# Patient Record
Sex: Male | Born: 1940 | Race: White | Hispanic: No | Marital: Married | Smoking: Former smoker
Health system: Southern US, Community
[De-identification: ages and names within clinical notes are randomized; demographics above are authoritative.]

## PROBLEM LIST (undated history)

## (undated) DIAGNOSIS — E039 Hypothyroidism, unspecified: Secondary | ICD-10-CM

## (undated) DIAGNOSIS — K219 Gastro-esophageal reflux disease without esophagitis: Secondary | ICD-10-CM

## (undated) HISTORY — PX: COLONOSCOPY: SHX174

## (undated) HISTORY — PX: ESOPHAGOGASTRODUODENOSCOPY ENDOSCOPY: SHX5814

---

## 2009-05-17 ENCOUNTER — Encounter: Admission: RE | Admit: 2009-05-17 | Discharge: 2009-05-17 | Payer: Self-pay | Admitting: Gastroenterology

## 2012-11-23 ENCOUNTER — Ambulatory Visit
Admission: RE | Admit: 2012-11-23 | Discharge: 2012-11-23 | Disposition: A | Discharge status: Home / Self Care | Payer: Medicare Other | Source: Ambulatory Visit | Attending: Family Medicine | Admitting: Family Medicine

## 2012-11-23 ENCOUNTER — Inpatient Hospital Stay (HOSPITAL_COMMUNITY)
Admission: EM | Admit: 2012-11-23 | Discharge: 2012-12-05 | DRG: 339 | Disposition: A | Discharge status: Home Health | Payer: Medicare Other | Attending: General Surgery | Admitting: General Surgery

## 2012-11-23 ENCOUNTER — Other Ambulatory Visit: Payer: Self-pay | Admitting: Family Medicine

## 2012-11-23 ENCOUNTER — Encounter (HOSPITAL_COMMUNITY): Payer: Self-pay | Admitting: *Deleted

## 2012-11-23 ENCOUNTER — Emergency Department (HOSPITAL_COMMUNITY): Payer: Medicare Other | Admitting: Anesthesiology

## 2012-11-23 ENCOUNTER — Encounter (HOSPITAL_COMMUNITY): Payer: Self-pay | Admitting: Anesthesiology

## 2012-11-23 ENCOUNTER — Emergency Department (HOSPITAL_COMMUNITY): Payer: Medicare Other

## 2012-11-23 ENCOUNTER — Encounter (HOSPITAL_COMMUNITY): Admission: EM | Disposition: A | Discharge status: Home Health | Payer: Self-pay | Source: Home / Self Care

## 2012-11-23 DIAGNOSIS — K352 Acute appendicitis with generalized peritonitis, without abscess: Secondary | ICD-10-CM

## 2012-11-23 DIAGNOSIS — R1031 Right lower quadrant pain: Secondary | ICD-10-CM

## 2012-11-23 DIAGNOSIS — K219 Gastro-esophageal reflux disease without esophagitis: Secondary | ICD-10-CM | POA: Diagnosis present

## 2012-11-23 DIAGNOSIS — I10 Essential (primary) hypertension: Secondary | ICD-10-CM | POA: Diagnosis present

## 2012-11-23 DIAGNOSIS — K3532 Acute appendicitis with perforation and localized peritonitis, without abscess: Secondary | ICD-10-CM | POA: Diagnosis present

## 2012-11-23 DIAGNOSIS — Z5331 Laparoscopic surgical procedure converted to open procedure: Secondary | ICD-10-CM

## 2012-11-23 DIAGNOSIS — K37 Unspecified appendicitis: Secondary | ICD-10-CM

## 2012-11-23 DIAGNOSIS — Z87891 Personal history of nicotine dependence: Secondary | ICD-10-CM

## 2012-11-23 DIAGNOSIS — K3533 Acute appendicitis with perforation and localized peritonitis, with abscess: Principal | ICD-10-CM | POA: Diagnosis present

## 2012-11-23 DIAGNOSIS — K560 Paralytic ileus: Secondary | ICD-10-CM | POA: Diagnosis not present

## 2012-11-23 DIAGNOSIS — E871 Hypo-osmolality and hyponatremia: Secondary | ICD-10-CM | POA: Diagnosis present

## 2012-11-23 DIAGNOSIS — E039 Hypothyroidism, unspecified: Secondary | ICD-10-CM | POA: Diagnosis present

## 2012-11-23 HISTORY — DX: Hypothyroidism, unspecified: E03.9

## 2012-11-23 HISTORY — PX: APPENDECTOMY: SHX54

## 2012-11-23 HISTORY — DX: Gastro-esophageal reflux disease without esophagitis: K21.9

## 2012-11-23 SURGERY — APPENDECTOMY
Anesthesia: General | Site: Abdomen | Laterality: Right | Wound class: Dirty or Infected

## 2012-11-23 MED ORDER — HYDROMORPHONE HCL PF 1 MG/ML IJ SOLN
INTRAMUSCULAR | Status: AC
  Filled 2012-11-23: qty 1

## 2012-11-23 MED ORDER — PROMETHAZINE HCL 25 MG/ML IJ SOLN: 6.2500 mg | INTRAMUSCULAR | Status: DC | PRN

## 2012-11-23 MED ORDER — BUPIVACAINE-EPINEPHRINE 0.25% -1:200000 IJ SOLN
INTRAMUSCULAR | Status: AC
  Filled 2012-11-23: qty 1

## 2012-11-23 MED ORDER — DEXAMETHASONE SODIUM PHOSPHATE 10 MG/ML IJ SOLN
INTRAMUSCULAR | Status: DC | PRN
  Administered 2012-11-23: 10 mg via INTRAVENOUS

## 2012-11-23 MED ORDER — SODIUM CHLORIDE 0.9 % IV SOLN
1000.0000 mL | Freq: Once | INTRAVENOUS | Status: AC
  Administered 2012-11-23: 1000 mL via INTRAVENOUS

## 2012-11-23 MED ORDER — SUCCINYLCHOLINE CHLORIDE 20 MG/ML IJ SOLN
INTRAMUSCULAR | Status: DC | PRN
  Administered 2012-11-23: 100 mg via INTRAVENOUS

## 2012-11-23 MED ORDER — LACTATED RINGERS IV SOLN
INTRAVENOUS | Status: DC | PRN
  Administered 2012-11-23: 21:00:00 via INTRAVENOUS

## 2012-11-23 MED ORDER — HYDROMORPHONE HCL PF 1 MG/ML IJ SOLN
0.2500 mg | INTRAMUSCULAR | Status: DC | PRN
  Administered 2012-11-23 (×3): 0.5 mg via INTRAVENOUS

## 2012-11-23 MED ORDER — ONDANSETRON HCL 4 MG/2ML IJ SOLN
INTRAMUSCULAR | Status: DC | PRN
  Administered 2012-11-23: 4 mg via INTRAVENOUS

## 2012-11-23 MED ORDER — 0.9 % SODIUM CHLORIDE (POUR BTL) OPTIME
TOPICAL | Status: DC | PRN
  Administered 2012-11-23: 1000 mL

## 2012-11-23 MED ORDER — SODIUM CHLORIDE 0.9 % IV SOLN
1000.0000 mL | INTRAVENOUS | Status: DC
  Administered 2012-11-23: 1000 mL via INTRAVENOUS

## 2012-11-23 MED ORDER — FENTANYL CITRATE 0.05 MG/ML IJ SOLN
INTRAMUSCULAR | Status: DC | PRN
  Administered 2012-11-23: 50 ug via INTRAVENOUS
  Administered 2012-11-23: 100 ug via INTRAVENOUS
  Administered 2012-11-23 (×2): 50 ug via INTRAVENOUS

## 2012-11-23 MED ORDER — BUPIVACAINE-EPINEPHRINE 0.25% -1:200000 IJ SOLN
INTRAMUSCULAR | Status: DC | PRN
  Administered 2012-11-23: 35 mL

## 2012-11-23 MED ORDER — PIPERACILLIN-TAZOBACTAM 3.375 G IVPB
3.3750 g | Freq: Once | INTRAVENOUS | Status: AC
  Administered 2012-11-23: 3.375 g via INTRAVENOUS
  Filled 2012-11-23: qty 50

## 2012-11-23 MED ORDER — NEOSTIGMINE METHYLSULFATE 1 MG/ML IJ SOLN
INTRAMUSCULAR | Status: DC | PRN
  Administered 2012-11-23: 4 mg via INTRAVENOUS

## 2012-11-23 MED ORDER — ROCURONIUM BROMIDE 100 MG/10ML IV SOLN
INTRAVENOUS | Status: DC | PRN
  Administered 2012-11-23: 5 mg via INTRAVENOUS
  Administered 2012-11-23: 25 mg via INTRAVENOUS

## 2012-11-23 MED ORDER — MORPHINE SULFATE 4 MG/ML IJ SOLN
6.0000 mg | Freq: Once | INTRAMUSCULAR | Status: AC
  Administered 2012-11-23: 4 mg via INTRAVENOUS
  Filled 2012-11-23: qty 1

## 2012-11-23 MED ORDER — IOHEXOL 300 MG/ML  SOLN
100.0000 mL | Freq: Once | INTRAMUSCULAR | Status: AC | PRN
  Administered 2012-11-23: 100 mL via INTRAVENOUS

## 2012-11-23 MED ORDER — GLYCOPYRROLATE 0.2 MG/ML IJ SOLN
INTRAMUSCULAR | Status: DC | PRN
  Administered 2012-11-23: .6 mg via INTRAVENOUS

## 2012-11-23 MED ORDER — PROPOFOL 10 MG/ML IV BOLUS
INTRAVENOUS | Status: DC | PRN
  Administered 2012-11-23: 150 mg via INTRAVENOUS

## 2012-11-23 MED ORDER — LACTATED RINGERS IV SOLN: INTRAVENOUS | Status: DC

## 2012-11-23 MED ORDER — LACTATED RINGERS IR SOLN
Status: DC | PRN
  Administered 2012-11-23: 1000 mL

## 2012-11-23 SURGICAL SUPPLY — 57 items
APPLIER CLIP ROT 10 11.4 M/L (STAPLE)
BENZOIN TINCTURE PRP APPL 2/3 (GAUZE/BANDAGES/DRESSINGS) IMPLANT
BLADE SURG SZ10 CARB STEEL (BLADE) ×3 IMPLANT
CANISTER SUCTION 2500CC (MISCELLANEOUS) ×3 IMPLANT
CLIP APPLIE ROT 10 11.4 M/L (STAPLE) IMPLANT
CLOTH BEACON ORANGE TIMEOUT ST (SAFETY) ×3 IMPLANT
CUTTER FLEX LINEAR 45M (STAPLE) IMPLANT
DECANTER SPIKE VIAL GLASS SM (MISCELLANEOUS) ×3 IMPLANT
DRAIN CHANNEL 19F RND (DRAIN) ×3 IMPLANT
DRAPE LAPAROSCOPIC ABDOMINAL (DRAPES) ×3 IMPLANT
ELECT REM PT RETURN 9FT ADLT (ELECTROSURGICAL) ×3
ELECTRODE REM PT RTRN 9FT ADLT (ELECTROSURGICAL) ×2 IMPLANT
ENDOLOOP SUT PDS II  0 18 (SUTURE)
ENDOLOOP SUT PDS II 0 18 (SUTURE) IMPLANT
EVACUATOR DRAINAGE 10X20 100CC (DRAIN) ×2 IMPLANT
EVACUATOR SILICONE 100CC (DRAIN) ×1
GLOVE BIOGEL PI IND STRL 7.0 (GLOVE) IMPLANT
GLOVE BIOGEL PI INDICATOR 7.0 (GLOVE)
GLOVE SURG ORTHO 8.0 STRL STRW (GLOVE) ×3 IMPLANT
GLOVE SURG SS PI 8.5 STRL IVOR (GLOVE) ×1
GLOVE SURG SS PI 8.5 STRL STRW (GLOVE) ×2 IMPLANT
GOWN STRL NON-REIN LRG LVL3 (GOWN DISPOSABLE) ×3 IMPLANT
GOWN STRL REIN XL XLG (GOWN DISPOSABLE) ×6 IMPLANT
HAND ACTIVATED (MISCELLANEOUS) ×3 IMPLANT
IV LACTATED RINGERS 1000ML (IV SOLUTION) ×3 IMPLANT
KIT BASIN OR (CUSTOM PROCEDURE TRAY) ×3 IMPLANT
NS IRRIG 1000ML POUR BTL (IV SOLUTION) ×3 IMPLANT
PENCIL BUTTON HOLSTER BLD 10FT (ELECTRODE) ×3 IMPLANT
POUCH SPECIMEN RETRIEVAL 10MM (ENDOMECHANICALS) IMPLANT
RELOAD 45 VASCULAR/THIN (ENDOMECHANICALS) IMPLANT
RELOAD STAPLE TA45 3.5 REG BLU (ENDOMECHANICALS) IMPLANT
RELOAD STAPLER LINE PROX 30 GR (STAPLE) ×2 IMPLANT
SET IRRIG TUBING LAPAROSCOPIC (IRRIGATION / IRRIGATOR) ×3 IMPLANT
SOLUTION ANTI FOG 6CC (MISCELLANEOUS) ×3 IMPLANT
SPONGE GAUZE 4X4 12PLY (GAUZE/BANDAGES/DRESSINGS) ×6 IMPLANT
SPONGE LAP 4X18 X RAY DECT (DISPOSABLE) ×3 IMPLANT
STAPLER RELOAD LINE PROX 30 GR (STAPLE) ×3
STAPLER VISISTAT 35W (STAPLE) ×3 IMPLANT
STRIP CLOSURE SKIN 1/2X4 (GAUZE/BANDAGES/DRESSINGS) IMPLANT
SUCTION POOLE TIP (SUCTIONS) ×3 IMPLANT
SUT ETHILON 2 0 PS N (SUTURE) ×3 IMPLANT
SUT MNCRL AB 4-0 PS2 18 (SUTURE) ×3 IMPLANT
SUT VIC AB 0 CT1 27 (SUTURE) ×1
SUT VIC AB 0 CT1 27XBRD ANTBC (SUTURE) ×2 IMPLANT
SUT VIC AB 1 CT1 27 (SUTURE) ×1
SUT VIC AB 1 CT1 27XBRD ANTBC (SUTURE) ×2 IMPLANT
SYR BULB IRRIGATION 50ML (SYRINGE) ×3 IMPLANT
TAPE CLOTH SURG 4X10 WHT LF (GAUZE/BANDAGES/DRESSINGS) ×3 IMPLANT
TOWEL OR 17X26 10 PK STRL BLUE (TOWEL DISPOSABLE) ×3 IMPLANT
TRAY FOLEY CATH 14FRSI W/METER (CATHETERS) ×3 IMPLANT
TRAY LAP CHOLE (CUSTOM PROCEDURE TRAY) ×3 IMPLANT
TROCAR BLADELESS OPT 5 75 (ENDOMECHANICALS) ×3 IMPLANT
TROCAR XCEL BLUNT TIP 100MML (ENDOMECHANICALS) ×3 IMPLANT
TROCAR XCEL NON-BLD 11X100MML (ENDOMECHANICALS) IMPLANT
TUBING INSUFFLATION 10FT LAP (TUBING) ×3 IMPLANT
WATER STERILE IRR 1500ML POUR (IV SOLUTION) IMPLANT
YANKAUER SUCT BULB TIP 10FT TU (MISCELLANEOUS) ×3 IMPLANT

## 2012-11-23 NOTE — H&P (Signed)
Jack Stephens is an 72 y.o. male.    General Surgery [facility]  Chief Complaint: abdominal pain, perforated acute appendicitis  HPI: patient is a pleasant 72 year old white male referred by his primary care physician for acute appendicitis with probable perforation. Patient has a 4 day history of generalized abdominal pain localizing to the right lower quadrant. This has been associated with low-grade fever, sweats, and chills. Patient presented to his primary physician. Laboratory studies showed an elevated white blood cell count of 22.3. CT scan of the abdomen and pelvis was performed and showed findings consistent with acute appendicitis with probable perforation. Patient was sent to the emergency department for surgical evaluation and management.  Patient has no prior history of abdominal surgery.  Past Medical History  Diagnosis Date  . Hypothyroidism   . GERD (gastroesophageal reflux disease)     Past Surgical History  Procedure Laterality Date  . Esophagogastroduodenoscopy endoscopy    . Colonoscopy      x 2    History reviewed. No pertinent family history. Social History:  reports that he quit smoking about 9 years ago. His smoking use included Pipe. He has never used smokeless tobacco. He reports that  drinks alcohol. He reports that he does not use illicit drugs.  Allergies: No Known Allergies   (Not in a hospital admission)  No results found for this or any previous visit (from the past 48 hour(s)). Dg Chest 1 View  11/23/2012  *RADIOLOGY REPORT*  Clinical Data: Preoperative assessment for appendectomy, former smoker  CHEST - 1 VIEW  Comparison: None  Findings: Upper normal heart size. Tortuous aorta. Pulmonary vascularity normal for technique and lordotic positioning. Lungs grossly clear. No pleural effusion or pneumothorax.  IMPRESSION: No definite acute abnormalities.   Original Report Authenticated By: Ulyses Southward, M.D.    Ct Abdomen Pelvis W  Contrast  11/23/2012  *RADIOLOGY REPORT*  Clinical Data: Right lower quadrant pain.  Evaluate for appendicitis.  Nausea.  CT ABDOMEN AND PELVIS WITH CONTRAST  Technique:  Multidetector CT imaging of the abdomen and pelvis was performed following the standard protocol during bolus administration of intravenous contrast.  Contrast: OMNIPAQUE IOHEXOL 300 MG/ML  SOLN  Comparison: None.  Findings: Lung bases show atelectasis or scarring in the right lower lobe.  No pleural fluid.  Coronary artery calcification. Heart size normal.  No pericardial effusion.  A focal area of low attenuation in the left hepatic lobe (image 18) is felt to represent volume averaging with adjacent fat, rather than a true lesion.  Liver, gallbladder, adrenal glands and right kidney are unremarkable.  A 4.4 x 8.4 cm low attenuation mass in the left kidney is likely a cyst.  Spleen, pancreas, stomach and majority of small bowel are unremarkable.  There is wall thickening involving the distal/terminal ileum and cecum.  Extensive inflammatory change and fluid are seen in the adjacent fat with a single locule of air (image 56).  The appendix is dilated, measuring 12 mm (image 59).  Remainder of the colon is unremarkable.  Ileocolic mesenteric lymph nodes are not enlarged by CT size criteria.  No pathologically enlarged lymph nodes.  Atherosclerotic calcification of the arterial vasculature without abdominal aortic aneurysm.  Small bilateral inguinal hernias contain fat.  No worrisome lytic or sclerotic lesions.  IMPRESSION:  Constellation of inflammatory findings in the right lower quadrant are most indicative of acute appendicitis with localized rupture. Report will be called to the referring physician's office by [facility]  CT technologist, Fulton Mole, at the time of dictation.   Original Report Authenticated By: Leanna Battles, M.D.     Review of Systems  Constitutional: Positive for fever and chills. Negative for weight loss,  malaise/fatigue and diaphoresis.  HENT: Negative.   Eyes: Negative.   Respiratory: Negative.   Cardiovascular: Negative.   Gastrointestinal: Positive for nausea and abdominal pain (localizing to RLQ). Negative for heartburn, vomiting, diarrhea, constipation, blood in stool and melena.  Genitourinary: Negative.   Musculoskeletal: Negative.   Skin: Negative.   Neurological: Negative.  Negative for weakness.  Endo/Heme/Allergies: Negative.   Psychiatric/Behavioral: Negative.     Blood pressure 145/80, pulse 91, temperature 100 F (37.8 C), temperature source Oral, resp. rate 20, SpO2 97.00%. Physical Exam  Constitutional: He is oriented to person, place, and time. He appears well-developed and well-nourished. No distress.  HENT:  Head: Normocephalic and atraumatic.  Right Ear: External ear normal.  Left Ear: External ear normal.  Mouth/Throat: Oropharynx is clear and moist.  Eyes: Conjunctivae are normal. Pupils are equal, round, and reactive to light. No scleral icterus.  Neck: Normal range of motion. Neck supple. No tracheal deviation present. No thyromegaly present.  Cardiovascular: Normal rate, regular rhythm and normal heart sounds.   No murmur heard. Respiratory: Effort normal and breath sounds normal. No respiratory distress.  GI: Soft. Bowel sounds are normal. He exhibits no distension and no mass. There is tenderness (localized to RLQ with guarding). There is guarding. There is no rebound.  Musculoskeletal: Normal range of motion. He exhibits no edema.  Lymphadenopathy:    He has no cervical adenopathy.  Neurological: He is alert and oriented to person, place, and time.  Skin: Skin is warm and dry. He is not diaphoretic.  Psychiatric: He has a normal mood and affect. His behavior is normal.     Assessment/Plan Acute appendicitis with probable perforation  I discussed the findings noted above with the patient and his family. We discussed laparoscopic appendectomy and the  possible need for conversion to open surgery. We discussed the hospital course to be anticipated and the need for postoperative antibiotic therapy. We discussed the possibility of an open abdominal wound. They understand and wish to proceed with surgery. We will make arrangements with the operating room to perform appendectomy on an urgent basis.  The risks and benefits of the procedure have been discussed at length with the patient.  The patient understands the proposed procedure, potential alternative treatments, and the course of recovery to be expected.  All of the patient's questions have been answered at this time.  The patient wishes to proceed with surgery.  Velora Heckler, MD, [facility] Office: [phone]    GERKIN,TODD M 11/23/2012, 9:06 PM

## 2012-11-23 NOTE — Preoperative (Signed)
Beta Blockers   Reason not to administer Beta Blockers:Not Applicable 

## 2012-11-23 NOTE — Transfer of Care (Signed)
Immediate Anesthesia Transfer of Care Note  Patient: Jack Stephens  Procedure(s) Performed: Procedure(s) with comments: laparoscopic appendectomy  (N/A) - OPEN  APPENDECTOMY  Patient Location: PACU  Anesthesia Type:General  Level of Consciousness: awake and patient cooperative  Airway & Oxygen Therapy: Patient Spontanous Breathing and Patient connected to face mask oxygen  Post-op Assessment: Report given to PACU RN and Post -op Vital signs reviewed and stable  Post vital signs: Reviewed and stable  Complications: No apparent anesthesia complications

## 2012-11-23 NOTE — ED Notes (Signed)
Patient started experiencing abdominal pain near dinner time last night. Started out generalized, then radiated for lower right quadrant. Went to family MD, CT was done and appendicitis was diagnosed.

## 2012-11-23 NOTE — ED Notes (Signed)
Informed Consent for Medical/Surgical/Diagnostic Procedure obtained.

## 2012-11-23 NOTE — Anesthesia Postprocedure Evaluation (Signed)
Anesthesia Post Note  Patient: Jack Stephens  Procedure(s) Performed: Procedure(s) (LRB): laparoscopic appendectomy  (N/A)  Anesthesia type: General  Patient location: PACU  Post pain: Pain level controlled  Post assessment: Post-op Vital signs reviewed  Last Vitals:  Filed Vitals:   11/23/12 2024  BP: 145/80  Pulse: 91  Temp: 37.8 C  Resp: 20    Post vital signs: Reviewed  Level of consciousness: sedated  Complications: No apparent anesthesia complications

## 2012-11-23 NOTE — ED Provider Notes (Signed)
History     CSN: [id number]  Arrival date & time 11/23/12  1725   First MD Initiated Contact with Patient 11/23/12 1732       chief complaint appendicitis   HPI Patient presents with 4 days of worsening abdominal pain is now localized in the right lower quadrant.  He saw his primary care physician as no data white blood cell count 22,000 was sent for an outpatient CT scan that confirmed appendicitis with possible rupture.  The patient came the emergency department.  General surgery is aware.  Patient reports pain at this time.  His pain is worsened by movement and palpation.  Nausea through the weekend without vomiting.  Chills without documented fever.     No past medical history on file.  No past surgical history on file.  No family history on file.  History  Substance Use Topics  . Smoking status: Not on file  . Smokeless tobacco: Not on file  . Alcohol Use: Not on file      Review of Systems  All other systems reviewed and are negative.    Allergies  Review of patient's allergies indicates not on file.  Home Medications  No current outpatient prescriptions on file.  There were no vitals taken for this visit.  Physical Exam  Nursing note and vitals reviewed. Constitutional: He is oriented to person, place, and time. He appears well-developed and well-nourished.  HENT:  Head: Normocephalic and atraumatic.  Eyes: EOM are normal.  Neck: Normal range of motion.  Cardiovascular: Normal rate, regular rhythm, normal heart sounds and intact distal pulses.   Pulmonary/Chest: Effort normal and breath sounds normal. No respiratory distress.  Abdominal: Soft. He exhibits no distension.  RLQ tenderness with guarding, no rebound. No peritonitis  Musculoskeletal: Normal range of motion.  Neurological: He is alert and oriented to person, place, and time.  Skin: Skin is warm and dry.  Psychiatric: He has a normal mood and affect. Judgment normal.    ED Course   Procedures (including critical care time)  Labs Reviewed  CBC WITH DIFFERENTIAL  BASIC METABOLIC PANEL  PROTIME-INR   Ct Abdomen Pelvis W Contrast  11/23/2012  *RADIOLOGY REPORT*  Clinical Data: Right lower quadrant pain.  Evaluate for appendicitis.  Nausea.  CT ABDOMEN AND PELVIS WITH CONTRAST  Technique:  Multidetector CT imaging of the abdomen and pelvis was performed following the standard protocol during bolus administration of intravenous contrast.  Contrast: OMNIPAQUE IOHEXOL 300 MG/ML  SOLN  Comparison: None.  Findings: Lung bases show atelectasis or scarring in the right lower lobe.  No pleural fluid.  Coronary artery calcification. Heart size normal.  No pericardial effusion.  A focal area of low attenuation in the left hepatic lobe (image 18) is felt to represent volume averaging with adjacent fat, rather than a true lesion.  Liver, gallbladder, adrenal glands and right kidney are unremarkable.  A 4.4 x 8.4 cm low attenuation mass in the left kidney is likely a cyst.  Spleen, pancreas, stomach and majority of small bowel are unremarkable.  There is wall thickening involving the distal/terminal ileum and cecum.  Extensive inflammatory change and fluid are seen in the adjacent fat with a single locule of air (image 56).  The appendix is dilated, measuring 12 mm (image 59).  Remainder of the colon is unremarkable.  Ileocolic mesenteric lymph nodes are not enlarged by CT size criteria.  No pathologically enlarged lymph nodes.  Atherosclerotic calcification of the arterial vasculature without abdominal aortic  aneurysm.  Small bilateral inguinal hernias contain fat.  No worrisome lytic or sclerotic lesions.  IMPRESSION:  Constellation of inflammatory findings in the right lower quadrant are most indicative of acute appendicitis with localized rupture. Report will be called to the referring physician's office by [facility] Imaging CT technologist, Fulton Mole, at the time of dictation.   Original  Report Authenticated By: Leanna Battles, M.D.    I personally reviewed the imaging tests through PACS system I reviewed available ER/hospitalization records through the EMR   1. Appendicitis       MDM  General surgery is aware and coming to see the patient at this time.  Labs already obtained.  Preop chest x-ray and EKG will be obtained.  Pain treated.        Lyanne Co, MD 11/23/12 [phone]

## 2012-11-23 NOTE — Brief Op Note (Signed)
11/23/2012  11:47 PM  PATIENT:  Jack Stephens  72 y.o. male  PRE-OPERATIVE DIAGNOSIS:  perforated appendix  POST-OPERATIVE DIAGNOSIS:  perforated appendicitis with abscess  PROCEDURE:  Procedure(s) with comments: laparoscopic appendectomy  (N/A) - OPEN  APPENDECTOMY  SURGEON:  Surgeon(s) and Role:    * Velora Heckler, MD - Primary  ANESTHESIA:   general  EBL:  Total I/O In: 500 [I.V.:500] Out: 150 [Urine:150]  BLOOD ADMINISTERED:none  DRAINS: (19Fr) Blake drain(s) in the right lower quadrant   LOCAL MEDICATIONS USED:  MARCAINE     SPECIMEN:  Excision  DISPOSITION OF SPECIMEN:  PATHOLOGY  COUNTS:  YES  TOURNIQUET:  * No tourniquets in log *  DICTATION: .Other Dictation: Dictation Number [id number]  PLAN OF CARE: Admit to inpatient   PATIENT DISPOSITION:  PACU - hemodynamically stable.   Delay start of Pharmacological VTE agent (>24hrs) due to surgical blood loss or risk of bleeding: yes  Velora Heckler, MD, [facility] Office: [phone]

## 2012-11-23 NOTE — Anesthesia Preprocedure Evaluation (Addendum)
Anesthesia Evaluation  Patient identified by MRN, date of birth, ID band Patient awake    Reviewed: Allergy & Precautions, H&P , NPO status , Patient's Chart, lab work & pertinent test results  Airway Mallampati: II TM Distance: >3 FB Neck ROM: Full    Dental  (+) Teeth Intact, Caps and Dental Advisory Given,    Pulmonary neg pulmonary ROS, former smoker,  breath sounds clear to auscultation  Pulmonary exam normal       Cardiovascular negative cardio ROS  Rhythm:Regular Rate:Normal     Neuro/Psych negative neurological ROS  negative psych ROS   GI/Hepatic negative GI ROS, Neg liver ROS, GERD-  Medicated,  Endo/Other  negative endocrine ROSHypothyroidism   Renal/GU negative Renal ROS  negative genitourinary   Musculoskeletal negative musculoskeletal ROS (+)   Abdominal   Peds  Hematology negative hematology ROS (+)   Anesthesia Other Findings   Reproductive/Obstetrics                          Anesthesia Physical Anesthesia Plan  ASA: II and emergent  Anesthesia Plan: General   Post-op Pain Management:    Induction: Intravenous, Rapid sequence and Cricoid pressure planned  Airway Management Planned: Oral ETT  Additional Equipment:   Intra-op Plan:   Post-operative Plan: Extubation in OR  Informed Consent: I have reviewed the patients History and Physical, chart, labs and discussed the procedure including the risks, benefits and alternatives for the proposed anesthesia with the patient or authorized representative who has indicated his/her understanding and acceptance.   Dental advisory given  Plan Discussed with: CRNA  Anesthesia Plan Comments:         Anesthesia Quick Evaluation

## 2012-11-24 ENCOUNTER — Encounter (HOSPITAL_COMMUNITY): Payer: Self-pay | Admitting: Surgery

## 2012-11-24 LAB — CBC
HCT: 42.4 % (ref 39.0–52.0)
Hemoglobin: 15.1 g/dL (ref 13.0–17.0)
MCH: 32.3 pg (ref 26.0–34.0)
MCHC: 35.6 g/dL (ref 30.0–36.0)
RBC: 4.67 MIL/uL (ref 4.22–5.81)

## 2012-11-24 LAB — BASIC METABOLIC PANEL
BUN: 11 mg/dL (ref 6–23)
CO2: 24 mEq/L (ref 19–32)
Calcium: 8.6 mg/dL (ref 8.4–10.5)
GFR calc non Af Amer: 70 mL/min — ABNORMAL LOW (ref >90)
Glucose, Bld: 143 mg/dL — ABNORMAL HIGH (ref 70–99)
Sodium: 134 mEq/L — ABNORMAL LOW (ref 135–145)

## 2012-11-24 MED ORDER — PIPERACILLIN-TAZOBACTAM 3.375 G IVPB
3.3750 g | Freq: Three times a day (TID) | INTRAVENOUS | Status: DC
  Administered 2012-11-24 – 2012-12-05 (×34): 3.375 g via INTRAVENOUS
  Filled 2012-11-24 (×36): qty 50

## 2012-11-24 MED ORDER — ACETAMINOPHEN 325 MG PO TABS: 650.0000 mg | ORAL_TABLET | ORAL | Status: DC | PRN

## 2012-11-24 MED ORDER — PROMETHAZINE HCL 25 MG/ML IJ SOLN
12.5000 mg | INTRAMUSCULAR | Status: DC | PRN
  Administered 2012-11-26 – 2012-12-02 (×10): 12.5 mg via INTRAVENOUS
  Filled 2012-11-24 (×10): qty 1

## 2012-11-24 MED ORDER — HYDROCODONE-ACETAMINOPHEN 5-325 MG PO TABS: 1.0000 | ORAL_TABLET | ORAL | Status: DC | PRN

## 2012-11-24 MED ORDER — KCL IN DEXTROSE-NACL 20-5-0.45 MEQ/L-%-% IV SOLN
INTRAVENOUS | Status: DC
  Administered 2012-11-24 – 2012-11-26 (×6): via INTRAVENOUS
  Administered 2012-11-26: 100 mL/h via INTRAVENOUS
  Administered 2012-11-27: 16:00:00 via INTRAVENOUS
  Administered 2012-11-27: 100 mL/h via INTRAVENOUS
  Administered 2012-11-28 – 2012-12-04 (×12): via INTRAVENOUS
  Filled 2012-11-24 (×28): qty 1000

## 2012-11-24 MED ORDER — HYDROMORPHONE HCL PF 1 MG/ML IJ SOLN
1.0000 mg | INTRAMUSCULAR | Status: DC | PRN
  Administered 2012-11-24 – 2012-12-04 (×50): 1 mg via INTRAVENOUS
  Filled 2012-11-24 (×53): qty 1

## 2012-11-24 NOTE — Op Note (Signed)
NAME:  Jack Stephens, Jack Stephens NO.:  [id number]  MEDICAL RECORD NO.:  [id number]  LOCATION:  1528                         FACILITY:  [facility]  PHYSICIAN:  Velora Heckler, MD      DATE OF BIRTH:  1941/06/26  DATE OF PROCEDURE:  11/23/2012                               OPERATIVE REPORT   PREOPERATIVE DIAGNOSIS:  Acute appendicitis with perforation.  POSTOPERATIVE DIAGNOSIS:  Acute appendicitis with perforation and Abscess.  PROCEDURE: 1. Attempted laparoscopic appendectomy  2. Open appendectomy  3. Drainage of intra-abdominal abscess  SURGEON:  Velora Heckler, MD, FACS  ANESTHESIA:  General per Jenelle Mages. Fortune, MD  ESTIMATED BLOOD LOSS:  Minimal.  PREPARATION:  ChloraPrep.  COMPLICATIONS:  None.  INDICATIONS:  The patient is a 72 year old, white male who presents on referral from Dr. Juan Quam with four-day history of abdominal pain localizing to the right lower quadrant.  Laboratory studies showed an elevated white blood cell count of 22,000 with a left shift.  CT scan abdomen and pelvis showed a diffuse inflammatory process in the right lower quadrant consistent with perforated appendicitis.  The patient was referred to General Surgery.  He was prepared and brought to the operating room for appendectomy.  BODY OF REPORT:  Procedure was done in OR #1 at the [facility].  Patient was brought to the operating room, placed in supine position on the operating room table.  Following administration of general anesthesia, the patient was positioned and then prepped and draped in the usual strict aseptic fashion.  After ascertaining that an adequate level of anesthesia had been achieved, an infraumbilical incision was made with a #15 blade.  Dissection was carried down to the fascia.  Fascia was incised in the midline and the peritoneal cavity was entered cautiously.  A 0 Vicryl purse-string suture was placed in the fascia.  A Hasson cannula was  introduced under direct vision and secured with a pursestring suture.  Abdomen was insufflated with carbon dioxide.  Laparoscope was introduced and the abdomen explored.  There was an inflammatory process in the right lower quadrant.  An operating port was placed in the right upper quadrant. Using a Glassman clamp, the small bowel was mobilized.  Omentum was mobilized.  Gentle pressure on the lateral cecal wall and overlying omentum releases an  abscess and purulent fluid.  This was evacuated. Terminal ileum was gently mobilized.  At this point, it was obvious that the appendix was forming the posterior portion of the abscess cavity. It was not possible to visualize or mobilize the appendix by laparoscopic technique.  Also the terminal ileum was forming the medial wall of the abscess cavity and concern is raised over possible perforation with persistence of laparoscopic technique.  Therefore, decision was made to convert to open surgery. Trocars were removed and 0 Vicryl pursestring suture was tied securely. Using a #10 blade, a transverse incision was made over the area of the abscess in the right lower quadrant of the abdomen.  Dissection was carried through subcutaneous tissues.  Abdominal wall was divided in layers and the peritoneal cavity was entered cautiously.  Using gentle blunt dissection, the appendix was identified and  gently mobilized from the retroperitoneum.  Also the terminal ileum was gently mobilized away from the abscess cavity.  The cecal wall was also gently mobilized with blunt dissection.  The appendix was then tracked to the cecum.  The mesoappendix was divided with the Harmonic scalpel.  Base of the appendix was identified and transected utilizing a 30 mm TA stapler. This provides a good seal at the base of the appendix at its junction with the cecal wall.  Remainder of the mesoappendix was transected with the Harmonic scalpel.  The appendix was delivered from the  abdomen and submitted as specimen.  Right lower quadrant was copiously irrigated with warm saline.  The pelvis was irrigated with warm saline.  Fluid was evacuated with the suction.  Good hemostasis was noted.  A 19-French Blake drain was brought through a mid right abdominal wall stab wound and placed into the abscess cavity around the base of the cecum.  It was secured to the skin with 2-0 nylon suture. Abdominal wall was closed in 2 layers, with the inner layer, a running 0 Vicryl suture.  The external oblique and anterior rectus fascia was closed with a running #1 Vicryl suture.  Subcutaneous tissues were copiously irrigated.  Port sites at the umbilicus and right upper quadrant were closed with stainless steel staples.  Right lower quadrant incision has 2 staples placed in the central portion to approximate the skin.  Subcutaneous tissues were then packed with Betadine-soaked 4x4 gauze, sponges.  Abdomen was washed and dried and sterile dressings were applied.  Drain was placed to bulb suction.  The patient was awakened from anesthesia and brought to the recovery room.  The patient tolerated the procedure well.   Velora Heckler, MD, [facility] Office: [phone]     TMG/MEDQ  D:  11/23/2012  T:  11/24/2012  Job:  [id number]  cc:   Dr. Juan Quam  Dr. Carlyon Shadow

## 2012-11-24 NOTE — Care Management Note (Signed)
    Page 1 of 1   11/24/2012     2:44:14 PM   CARE MANAGEMENT NOTE 11/24/2012  Patient:  Jack Stephens, Jack Stephens   Account Number:  [id number]  Date Initiated:  11/24/2012  Documentation initiated by:  Lorenda Ishihara  Subjective/Objective Assessment:   72 yo male admitted s/p lap appy with perf. PTA lived at home with spouse.     Action/Plan:   Home when stable.   Anticipated DC Date:  11/26/2012   Anticipated DC Plan:  HOME/SELF CARE      DC Planning Services  CM consult      Choice offered to / List presented to:             Status of service:  Completed, signed off Medicare Important Message given?   (If response is "NO", the following Medicare IM given date fields will be blank) Date Medicare IM given:   Date Additional Medicare IM given:    Discharge Disposition:  HOME/SELF CARE  Per UR Regulation:  Reviewed for med. necessity/level of care/duration of stay  If discussed at Long Length of Stay Meetings, dates discussed:    Comments:

## 2012-11-24 NOTE — Progress Notes (Signed)
1 Day Post-Op  Subjective: No complaints this am. Pain under control with meds. No nausea  Objective: Vital signs in last 24 hours: Temp:  [97 F (36.1 C)-100.6 F (38.1 C)] 98.5 F (36.9 C) (03/11 0621) Pulse Rate:  [85-99] 88 (03/11 0621) Resp:  [16-23] 16 (03/11 0621) BP: (129-162)/(60-91) 162/81 mmHg (03/11 0621) SpO2:  [95 %-99 %] 95 % (03/11 0621) Last BM Date: 11/23/12  Intake/Output from previous day: 03/10 0701 - 03/11 0700 In: 1200 [I.V.:1200] Out: 595 [Urine:550; Drains:45] Intake/Output this shift:    GI: soft, mild tenderness. quiet. mild distension  Lab Results:   Recent Labs  11/24/12 0410  WBC 19.8*  HGB 15.1  HCT 42.4  PLT 238   BMET  Recent Labs  11/24/12 0410  NA 134*  K 4.1  CL 97  CO2 24  GLUCOSE 143*  BUN 11  CREATININE 1.04  CALCIUM 8.6   PT/INR No results found for this basename: LABPROT, INR,  in the last 72 hours ABG No results found for this basename: PHART, PCO2, PO2, HCO3,  in the last 72 hours  Studies/Results: Dg Chest 1 View  11/23/2012  *RADIOLOGY REPORT*  Clinical Data: Preoperative assessment for appendectomy, former smoker  CHEST - 1 VIEW  Comparison: None  Findings: Upper normal heart size. Tortuous aorta. Pulmonary vascularity normal for technique and lordotic positioning. Lungs grossly clear. No pleural effusion or pneumothorax.  IMPRESSION: No definite acute abnormalities.   Original Report Authenticated By: Ulyses Southward, M.D.    Ct Abdomen Pelvis W Contrast  11/23/2012  *RADIOLOGY REPORT*  Clinical Data: Right lower quadrant pain.  Evaluate for appendicitis.  Nausea.  CT ABDOMEN AND PELVIS WITH CONTRAST  Technique:  Multidetector CT imaging of the abdomen and pelvis was performed following the standard protocol during bolus administration of intravenous contrast.  Contrast: OMNIPAQUE IOHEXOL 300 MG/ML  SOLN  Comparison: None.  Findings: Lung bases show atelectasis or scarring in the right lower lobe.  No pleural  fluid.  Coronary artery calcification. Heart size normal.  No pericardial effusion.  A focal area of low attenuation in the left hepatic lobe (image 18) is felt to represent volume averaging with adjacent fat, rather than a true lesion.  Liver, gallbladder, adrenal glands and right kidney are unremarkable.  A 4.4 x 8.4 cm low attenuation mass in the left kidney is likely a cyst.  Spleen, pancreas, stomach and majority of small bowel are unremarkable.  There is wall thickening involving the distal/terminal ileum and cecum.  Extensive inflammatory change and fluid are seen in the adjacent fat with a single locule of air (image 56).  The appendix is dilated, measuring 12 mm (image 59).  Remainder of the colon is unremarkable.  Ileocolic mesenteric lymph nodes are not enlarged by CT size criteria.  No pathologically enlarged lymph nodes.  Atherosclerotic calcification of the arterial vasculature without abdominal aortic aneurysm.  Small bilateral inguinal hernias contain fat.  No worrisome lytic or sclerotic lesions.  IMPRESSION:  Constellation of inflammatory findings in the right lower quadrant are most indicative of acute appendicitis with localized rupture. Report will be called to the referring physician's office by [facility] CT technologist, Fulton Mole, at the time of dictation.   Original Report Authenticated By: Leanna Battles, M.D.     Anti-infectives: Anti-infectives   Start     Dose/Rate Route Frequency Ordered Stop   11/24/12 0200  piperacillin-tazobactam (ZOSYN) IVPB 3.375 g     3.375 g 12.5 mL/hr over 240 Minutes  Intravenous 3 times per day 11/24/12 0059     11/23/12 1915  piperacillin-tazobactam (ZOSYN) IVPB 3.375 g     3.375 g 100 mL/hr over 30 Minutes Intravenous  Once 11/23/12 1911 11/23/12 2015      Assessment/Plan: s/p Procedure(s) with comments: laparoscopic appendectomy  (N/A) - OPEN  APPENDECTOMY continue ice chips until bowel function returns Continue abx OOB  LOS: 1 day     TOTH III,PAUL S 11/24/2012

## 2012-11-25 LAB — CBC
Hemoglobin: 13.4 g/dL (ref 13.0–17.0)
MCHC: 35.4 g/dL (ref 30.0–36.0)
Platelets: 296 10*3/uL (ref 150–400)

## 2012-11-25 LAB — BASIC METABOLIC PANEL
GFR calc Af Amer: >90 mL/min (ref >90)
GFR calc non Af Amer: 85 mL/min — ABNORMAL LOW (ref >90)
Glucose, Bld: 143 mg/dL — ABNORMAL HIGH (ref 70–99)
Potassium: 4.2 mEq/L (ref 3.5–5.1)
Sodium: 136 mEq/L (ref 135–145)

## 2012-11-25 NOTE — Progress Notes (Signed)
2 Days Post-Op  Subjective: Feels ok, some pain in incision areas, denies v/n, denies bm  Objective: Vital signs in last 24 hours: Temp:  [98.1 F (36.7 C)-98.7 F (37.1 C)] 98.3 F (36.8 C) (03/12 0554) Pulse Rate:  [76-92] 76 (03/12 0554) Resp:  [18-20] 18 (03/12 0554) BP: (119-152)/(70-79) 119/71 mmHg (03/12 0554) SpO2:  [95 %-97 %] 96 % (03/12 0554) Weight:  [170 lb (77.111 kg)] 170 lb (77.111 kg) (03/12 0359) Last BM Date: 11/23/12  Intake/Output from previous day: 03/11 0701 - 03/12 0700 In: 2853.3 [I.V.:2853.3] Out: 2375 [Urine:2300; Drains:75] Intake/Output this shift:    PE: Abd: a little distended, appropriately tender, +bs, incision with widely spaced staples and wicks, appears healthy General: awake alert, NAD  Lab Results:   Recent Labs  11/24/12 0410 11/25/12 0417  WBC 19.8* 23.2*  HGB 15.1 13.4  HCT 42.4 37.8*  PLT 238 296   BMET  Recent Labs  11/24/12 0410 11/25/12 0417  NA 134* 136  K 4.1 4.2  CL 97 101  CO2 24 26  GLUCOSE 143* 143*  BUN 11 13  CREATININE 1.04 0.87  CALCIUM 8.6 8.7   PT/INR No results found for this basename: LABPROT, INR,  in the last 72 hours CMP     Component Value Date/Time   NA 136 11/25/2012 0417   K 4.2 11/25/2012 0417   CL 101 11/25/2012 0417   CO2 26 11/25/2012 0417   GLUCOSE 143* 11/25/2012 0417   BUN 13 11/25/2012 0417   CREATININE 0.87 11/25/2012 0417   CALCIUM 8.7 11/25/2012 0417   GFRNONAA 85* 11/25/2012 0417   GFRAA >90 11/25/2012 0417   Lipase  No results found for this basename: lipase       Studies/Results: Dg Chest 1 View  11/23/2012  *RADIOLOGY REPORT*  Clinical Data: Preoperative assessment for appendectomy, former smoker  CHEST - 1 VIEW  Comparison: None  Findings: Upper normal heart size. Tortuous aorta. Pulmonary vascularity normal for technique and lordotic positioning. Lungs grossly clear. No pleural effusion or pneumothorax.  IMPRESSION: No definite acute abnormalities.   Original Report  Authenticated By: Ulyses Southward, M.D.    Ct Abdomen Pelvis W Contrast  11/23/2012  *RADIOLOGY REPORT*  Clinical Data: Right lower quadrant pain.  Evaluate for appendicitis.  Nausea.  CT ABDOMEN AND PELVIS WITH CONTRAST  Technique:  Multidetector CT imaging of the abdomen and pelvis was performed following the standard protocol during bolus administration of intravenous contrast.  Contrast: OMNIPAQUE IOHEXOL 300 MG/ML  SOLN  Comparison: None.  Findings: Lung bases show atelectasis or scarring in the right lower lobe.  No pleural fluid.  Coronary artery calcification. Heart size normal.  No pericardial effusion.  A focal area of low attenuation in the left hepatic lobe (image 18) is felt to represent volume averaging with adjacent fat, rather than a true lesion.  Liver, gallbladder, adrenal glands and right kidney are unremarkable.  A 4.4 x 8.4 cm low attenuation mass in the left kidney is likely a cyst.  Spleen, pancreas, stomach and majority of small bowel are unremarkable.  There is wall thickening involving the distal/terminal ileum and cecum.  Extensive inflammatory change and fluid are seen in the adjacent fat with a single locule of air (image 56).  The appendix is dilated, measuring 12 mm (image 59).  Remainder of the colon is unremarkable.  Ileocolic mesenteric lymph nodes are not enlarged by CT size criteria.  No pathologically enlarged lymph nodes.  Atherosclerotic calcification of the  arterial vasculature without abdominal aortic aneurysm.  Small bilateral inguinal hernias contain fat.  No worrisome lytic or sclerotic lesions.  IMPRESSION:  Constellation of inflammatory findings in the right lower quadrant are most indicative of acute appendicitis with localized rupture. Report will be called to the referring physician's office by [facility] CT technologist, Fulton Mole, at the time of dictation.   Original Report Authenticated By: Leanna Battles, M.D.     Anti-infectives: Anti-infectives    Start     Dose/Rate Route Frequency Ordered Stop   11/24/12 0200  piperacillin-tazobactam (ZOSYN) IVPB 3.375 g     3.375 g 12.5 mL/hr over 240 Minutes Intravenous 3 times per day 11/24/12 0059     11/23/12 1915  piperacillin-tazobactam (ZOSYN) IVPB 3.375 g     3.375 g 100 mL/hr over 30 Minutes Intravenous  Once 11/23/12 1911 11/23/12 2015       Assessment/Plan 1. POD#2-open appy: doing ok overall, start dressing changes today, mobilize frequently, continue drain, sips of limited clears, keep on IV abx and fluids   LOS: 2 days    WHITE, ELIZABETH 11/25/2012

## 2012-11-26 LAB — CBC
HCT: 46.1 % (ref 39.0–52.0)
Hemoglobin: 16.6 g/dL (ref 13.0–17.0)
MCH: 32.6 pg (ref 26.0–34.0)
MCHC: 36 g/dL (ref 30.0–36.0)
MCV: 90.6 fL (ref 78.0–100.0)
Platelets: 379 10*3/uL (ref 150–400)
RBC: 5.09 MIL/uL (ref 4.22–5.81)
RDW: 12.7 % (ref 11.5–15.5)
WBC: 19 10*3/uL — ABNORMAL HIGH (ref 4.0–10.5)

## 2012-11-26 NOTE — Progress Notes (Signed)
Pt's JP drain had noted to have increased drains than usual about 300cc total since 10 am this morning w/ serosanguinous drain. Pt had c/o pain to lower abdominal area relieved with Dilaudid IV. Pt also has c/o nausea & has prn Phenergan for it. Clance Boll, PA had downgraded pt's diet to NPO except ice chips/ limited  clears this am. Tresa Endo, [place] had been paged to update of pt's drain status. Awaiting for PA's call back.

## 2012-11-26 NOTE — Progress Notes (Signed)
Patient ID: Jack Stephens, male   DOB: 10/31/1940, 72 y.o.   MRN: [id number] 3 Days Post-Op  Subjective: Very rough night, lots of bloating and nausea, denies vomiting, no flatus or BM, denies fevers or chills  Objective: Vital signs in last 24 hours: Temp:  [97.6 F (36.4 C)-98.1 F (36.7 C)] 97.6 F (36.4 C) (03/13 0556) Pulse Rate:  [66-77] 69 (03/13 0556) Resp:  [18] 18 (03/13 0556) BP: (122-151)/(83-87) 122/84 mmHg (03/13 0556) SpO2:  [91 %-96 %] 91 % (03/13 0556) Last BM Date: 11/23/12  Intake/Output from previous day: 03/12 0701 - 03/13 0700 In: 3108.3 [P.O.:720; I.V.:2388.3] Out: 2450 [Urine:2325; Drains:125] Intake/Output this shift:    PE: Abd: very distended, although appropriately tender, +bs, incision with widely spaced staples and wicks, appears healthy Drain with serosang drainage General: awake alert, NAD  Lab Results:   Recent Labs  11/24/12 0410 11/25/12 0417  WBC 19.8* 23.2*  HGB 15.1 13.4  HCT 42.4 37.8*  PLT 238 296   BMET  Recent Labs  11/24/12 0410 11/25/12 0417  NA 134* 136  K 4.1 4.2  CL 97 101  CO2 24 26  GLUCOSE 143* 143*  BUN 11 13  CREATININE 1.04 0.87  CALCIUM 8.6 8.7   PT/INR No results found for this basename: LABPROT, INR,  in the last 72 hours CMP     Component Value Date/Time   NA 136 11/25/2012 0417   K 4.2 11/25/2012 0417   CL 101 11/25/2012 0417   CO2 26 11/25/2012 0417   GLUCOSE 143* 11/25/2012 0417   BUN 13 11/25/2012 0417   CREATININE 0.87 11/25/2012 0417   CALCIUM 8.7 11/25/2012 0417   GFRNONAA 85* 11/25/2012 0417   GFRAA >90 11/25/2012 0417   Lipase  No results found for this basename: lipase       Studies/Results: No results found.  Anti-infectives: Anti-infectives   Start     Dose/Rate Route Frequency Ordered Stop   11/24/12 0200  piperacillin-tazobactam (ZOSYN) IVPB 3.375 g     3.375 g 12.5 mL/hr over 240 Minutes Intravenous 3 times per day 11/24/12 0059     11/23/12 1915   piperacillin-tazobactam (ZOSYN) IVPB 3.375 g     3.375 g 100 mL/hr over 30 Minutes Intravenous  Once 11/23/12 1911 11/23/12 2015       Assessment/Plan 1. POD#2-open appy: rough night, likely developing ileus, concerned about WBC yesterday and will recheck that today, likely need to change antibiotics, mobilize frequently, continue drain, sips of limited clears, keep on IV abx and fluids   LOS: 3 days    Fraida Veldman 11/26/2012

## 2012-11-27 LAB — CBC
HCT: 49 % (ref 39.0–52.0)
Hemoglobin: 17.2 g/dL — ABNORMAL HIGH (ref 13.0–17.0)
MCH: 32.6 pg (ref 26.0–34.0)
MCHC: 35.1 g/dL (ref 30.0–36.0)
MCV: 92.8 fL (ref 78.0–100.0)
Platelets: 380 10*3/uL (ref 150–400)
RBC: 5.28 MIL/uL (ref 4.22–5.81)
RDW: 12.9 % (ref 11.5–15.5)
WBC: 15.5 10*3/uL — ABNORMAL HIGH (ref 4.0–10.5)

## 2012-11-27 MED ORDER — LEVOTHYROXINE SODIUM 100 MCG IV SOLR
50.0000 ug | Freq: Every day | INTRAVENOUS | Status: DC
  Administered 2012-11-27 – 2012-11-28 (×2): 52 ug via INTRAVENOUS
  Administered 2012-11-29 – 2012-11-30 (×2): 50 ug via INTRAVENOUS
  Filled 2012-11-27 (×5): qty 5

## 2012-11-27 MED ORDER — LIP MEDEX EX OINT
TOPICAL_OINTMENT | CUTANEOUS | Status: AC
  Administered 2012-11-27: 12:00:00
  Filled 2012-11-27: qty 7

## 2012-11-27 MED ORDER — LEVOTHYROXINE SODIUM 100 MCG IV SOLR
50.0000 ug | Freq: Every day | INTRAVENOUS | Status: DC
  Filled 2012-11-27 (×2): qty 5

## 2012-11-27 NOTE — Progress Notes (Signed)
Patient ID: Jack Stephens, male   DOB: 1941/05/02, 72 y.o.   MRN: [id number] 4 Days Post-Op  Subjective: Feels better today but still having nausea and pain, denies vomiting, no flatus or BM  Objective: Vital signs in last 24 hours: Temp:  [97.6 F (36.4 C)-98.1 F (36.7 C)] 98.1 F (36.7 C) (03/14 0553) Pulse Rate:  [85-88] 85 (03/14 0553) Resp:  [20] 20 (03/14 0553) BP: (129-137)/(86-98) 137/86 mmHg (03/14 0553) SpO2:  [91 %-93 %] 91 % (03/14 0553) Last BM Date: 11/23/12  Intake/Output from previous day: 03/13 0701 - 03/14 0700 In: 2460 [P.O.:60; I.V.:2400] Out: 2600 [Urine:1650; Drains:950] Intake/Output this shift:    PE: Abd: still very distended, although appropriately tender, +bs, incision with widely spaced staples and wicks, appears healthy Drain with more cloudy drainage (950cc/24 hr) General: awake alert, NAD  Lab Results:   Recent Labs  11/25/12 0417 11/26/12 1032  WBC 23.2* 19.0*  HGB 13.4 16.6  HCT 37.8* 46.1  PLT 296 379   BMET  Recent Labs  11/25/12 0417  NA 136  K 4.2  CL 101  CO2 26  GLUCOSE 143*  BUN 13  CREATININE 0.87  CALCIUM 8.7   PT/INR No results found for this basename: LABPROT, INR,  in the last 72 hours CMP     Component Value Date/Time   NA 136 11/25/2012 0417   K 4.2 11/25/2012 0417   CL 101 11/25/2012 0417   CO2 26 11/25/2012 0417   GLUCOSE 143* 11/25/2012 0417   BUN 13 11/25/2012 0417   CREATININE 0.87 11/25/2012 0417   CALCIUM 8.7 11/25/2012 0417   GFRNONAA 85* 11/25/2012 0417   GFRAA >90 11/25/2012 0417   Lipase  No results found for this basename: lipase       Studies/Results: No results found.  Anti-infectives: Anti-infectives   Start     Dose/Rate Route Frequency Ordered Stop   11/24/12 0200  piperacillin-tazobactam (ZOSYN) IVPB 3.375 g     3.375 g 12.5 mL/hr over 240 Minutes Intravenous 3 times per day 11/24/12 0059     11/23/12 1915  piperacillin-tazobactam (ZOSYN) IVPB 3.375 g     3.375 g 100 mL/hr  over 30 Minutes Intravenous  Once 11/23/12 1911 11/23/12 2015       Assessment/Plan 1. POD#3-open appy: drainage now appearing more cloudy, will recheck CBC today, keep on ice chips, WBC was trending down yesterday but drainage looking worse today, cont Zosyn, IVfs and bowel rest  --cbc now  --zosyn    r LOS: 4 days    WHITE, ELIZABETH 11/27/2012

## 2012-11-28 MED ORDER — HYDRALAZINE HCL 20 MG/ML IJ SOLN
10.0000 mg | Freq: Once | INTRAMUSCULAR | Status: AC
  Administered 2012-11-28: 10 mg via INTRAVENOUS

## 2012-11-28 MED ORDER — HYDRALAZINE HCL 20 MG/ML IJ SOLN
INTRAMUSCULAR | Status: AC
  Filled 2012-11-28: qty 1

## 2012-11-28 MED ORDER — ENOXAPARIN SODIUM 40 MG/0.4ML ~~LOC~~ SOLN
40.0000 mg | Freq: Every day | SUBCUTANEOUS | Status: DC
  Administered 2012-11-28 – 2012-12-05 (×8): 40 mg via SUBCUTANEOUS
  Filled 2012-11-28 (×8): qty 0.4

## 2012-11-28 NOTE — Progress Notes (Signed)
General Surgery Note  LOS: 5 days  POD -  5 Days Post-Op  Assessment/Plan: 1.  Open appendectomy - 11/23/2012 - T. Gerkin  Zosyn  WBC better - 15,500 - 11/27/2012  But has ileus with no bowel function so far  Continue ice chips only and check labs tomorrow.  If no better in AM, will get KUB.  2.  DVT prophylaxis - on none  Will start Lovenox 3.  GERD  Subjective:  No flatus or BM.  Felt nauseated when he took coffee.  Somewhat distended.  Has a remote temperature checking device.  He's an Art gallery manager.  Originally from [place]. Objective:   Filed Vitals:   11/28/12 0530  BP: 165/85  Pulse: 95  Temp: 98.1 F (36.7 C)  Resp: 18     Intake/Output from previous day:  03/14 0701 - 03/15 0700 In: 2421.7 [I.V.:2421.7] Out: 2175 [Urine:1725; Drains:450]  Intake/Output this shift:      Physical Exam:   General: WN older WM, bearded, who is alert and oriented.    HEENT: Normal. Pupils equal. .   Lungs: Clear   Abdomen: Distended with a few BS.   Wound: Drain in RLQ.  Open wound.  I removed the staples holding the wound together, but the wound is clean.   Lab Results:    Recent Labs  11/26/12 1032 11/27/12 0830  WBC 19.0* 15.5*  HGB 16.6 17.2*  HCT 46.1 49.0  PLT 379 380    BMET  No results found for this basename: NA, K, CL, CO2, GLUCOSE, BUN, CREATININE, CALCIUM,  in the last 72 hours  PT/INR  No results found for this basename: LABPROT, INR,  in the last 72 hours  ABG  No results found for this basename: PHART, PCO2, PO2, HCO3,  in the last 72 hours   Studies/Results:  No results found.   Anti-infectives:   Anti-infectives   Start     Dose/Rate Route Frequency Ordered Stop   11/24/12 0200  piperacillin-tazobactam (ZOSYN) IVPB 3.375 g     3.375 g 12.5 mL/hr over 240 Minutes Intravenous 3 times per day 11/24/12 0059     11/23/12 1915  piperacillin-tazobactam (ZOSYN) IVPB 3.375 g     3.375 g 100 mL/hr over 30 Minutes Intravenous  Once 11/23/12 1911 11/23/12 2015       Ovidio Kin, MD, FACS Pager: [phone],   [facility] Office: [phone] 11/28/2012

## 2012-11-28 NOTE — Progress Notes (Signed)
Patient with a blood pressure 170/112.  Patient denies pain, double visison, or headache.  Dr Andrey Campanile paged await response.  Orders received

## 2012-11-29 ENCOUNTER — Inpatient Hospital Stay (HOSPITAL_COMMUNITY): Payer: Medicare Other

## 2012-11-29 LAB — BASIC METABOLIC PANEL
BUN: 11 mg/dL (ref 6–23)
CO2: 25 mEq/L (ref 19–32)
Calcium: 8.1 mg/dL — ABNORMAL LOW (ref 8.4–10.5)
GFR calc non Af Amer: 84 mL/min — ABNORMAL LOW (ref >90)
Glucose, Bld: 104 mg/dL — ABNORMAL HIGH (ref 70–99)
Potassium: 3.9 mEq/L (ref 3.5–5.1)
Sodium: 130 mEq/L — ABNORMAL LOW (ref 135–145)

## 2012-11-29 LAB — CBC WITH DIFFERENTIAL/PLATELET
Basophils Relative: 0 % (ref 0–1)
Eosinophils Absolute: 0.3 10*3/uL (ref 0.0–0.7)
Eosinophils Relative: 2 % (ref 0–5)
HCT: 43.3 % (ref 39.0–52.0)
Hemoglobin: 15 g/dL (ref 13.0–17.0)
MCH: 31.8 pg (ref 26.0–34.0)
MCHC: 34.6 g/dL (ref 30.0–36.0)
MCV: 91.7 fL (ref 78.0–100.0)
Monocytes Absolute: 0.9 10*3/uL (ref 0.1–1.0)
Neutro Abs: 9.3 10*3/uL — ABNORMAL HIGH (ref 1.7–7.7)
RBC: 4.72 MIL/uL (ref 4.22–5.81)

## 2012-11-29 MED ORDER — METOPROLOL TARTRATE 1 MG/ML IV SOLN
5.0000 mg | Freq: Four times a day (QID) | INTRAVENOUS | Status: DC | PRN
  Administered 2012-11-29 – 2012-11-30 (×2): 5 mg via INTRAVENOUS
  Filled 2012-11-29 (×3): qty 5

## 2012-11-29 NOTE — Progress Notes (Signed)
General Surgery Note  LOS: 6 days  POD -  6 Days Post-Op Room - 1530  Assessment/Plan: 1.  Open appendectomy - 11/23/2012 - T. Gerkin  Zosyn  WBC better - 12,700 - 11/29/2012  Still no bowel function - Ileus vs SBO - will get KUB today.  Failing to progress.  May need to consider repeat CT.  2.  DVT prophylaxis - on none  Will start Lovenox 3.  GERD 4.  Hypertension  Start IV lopressor  Subjective:  No flatus or BM.  About the same as yesterday.  No nausea or vomiting.  Originally from [place]. Objective:   Filed Vitals:   11/29/12 0521  BP: 152/93  Pulse: 90  Temp: 98.2 F (36.8 C)  Resp:      Intake/Output from previous day:  03/15 0701 - 03/16 0700 In: 2475 [I.V.:2375; IV Piggyback:100] Out: 1070 [Urine:900; Drains:170]  Intake/Output this shift:      Physical Exam:   General: WN older WM, bearded, who is alert and oriented.    HEENT: Normal. Pupils equal. .   Lungs: Clear   Abdomen: Distended with a very few BS.   Wound: Drain in RLQ with straw colored fluid.  Open wound. is clean.   Lab Results:     Recent Labs  11/27/12 0830 11/29/12 0432  WBC 15.5* 12.7*  HGB 17.2* 15.0  HCT 49.0 43.3  PLT 380 433*    BMET    Recent Labs  11/29/12 0432  NA 130*  K 3.9  CL 98  CO2 25  GLUCOSE 104*  BUN 11  CREATININE 0.89  CALCIUM 8.1*    PT/INR  No results found for this basename: LABPROT, INR,  in the last 72 hours  ABG  No results found for this basename: PHART, PCO2, PO2, HCO3,  in the last 72 hours   Studies/Results:  No results found.   Anti-infectives:   Anti-infectives   Start     Dose/Rate Route Frequency Ordered Stop   11/24/12 0200  piperacillin-tazobactam (ZOSYN) IVPB 3.375 g     3.375 g 12.5 mL/hr over 240 Minutes Intravenous 3 times per day 11/24/12 0059     11/23/12 1915  piperacillin-tazobactam (ZOSYN) IVPB 3.375 g     3.375 g 100 mL/hr over 30 Minutes Intravenous  Once 11/23/12 1911 11/23/12 2015      Ovidio Kin, MD,  FACS Pager: [phone],   [facility] Office: [phone] 11/29/2012

## 2012-11-30 LAB — CBC WITH DIFFERENTIAL/PLATELET
Basophils Relative: 0 % (ref 0–1)
Eosinophils Absolute: 0.3 10*3/uL (ref 0.0–0.7)
Eosinophils Relative: 3 % (ref 0–5)
Hemoglobin: 14.8 g/dL (ref 13.0–17.0)
Lymphs Abs: 2.1 10*3/uL (ref 0.7–4.0)
MCH: 31.3 pg (ref 26.0–34.0)
MCHC: 34.1 g/dL (ref 30.0–36.0)
MCV: 91.8 fL (ref 78.0–100.0)
Monocytes Relative: 8 % (ref 3–12)
Neutrophils Relative %: 67 % (ref 43–77)
RBC: 4.73 MIL/uL (ref 4.22–5.81)

## 2012-11-30 NOTE — Progress Notes (Signed)
Patient ID: Jack Stephens, male   DOB: 1941-03-09, 72 y.o.   MRN: [id number]  General Surgery - [facility] - Progress Note  POD# 7  Subjective: Patient pleasant, mild pain.  Passed gas last night.  Taking popsicles by mouth without nausea.  Objective: Vital signs in last 24 hours: Temp:  [97.8 F (36.6 C)-98.1 F (36.7 C)] 98.1 F (36.7 C) (03/17 0520) Pulse Rate:  [64-87] 75 (03/17 0520) Resp:  [18] 18 (03/17 0520) BP: (133-181)/(91-101) 152/91 mmHg (03/17 0520) SpO2:  [92 %-93 %] 92 % (03/17 0520) Last BM Date: 11/23/12  Intake/Output from previous day: 12-15-22 0701 - 03/17 0700 In: 1868.3 [I.V.:1708.3; IV Piggyback:100] Out: 4098 [JXBJY:7829; Drains:247]  Exam: HEENT - clear, not icteric Neck - soft Chest - clear bilaterally Cor - RRR, no murmur Abd - mild distension; BS present; dressing dry and intact; moderate clear yellow fluid in JP drain Ext - no significant edema Neuro - grossly intact, no focal deficits  Lab Results:   Recent Labs  2012-12-14 0432 11/30/12 0410  WBC 12.7* 10.0  HGB 15.0 14.8  HCT 43.3 43.4  PLT 433* 447*     Recent Labs  December 14, 2012 0432  NA 130*  K 3.9  CL 98  CO2 25  GLUCOSE 104*  BUN 11  CREATININE 0.89  CALCIUM 8.1*    Studies/Results: Dg Abd 2 Views  12/14/12  *RADIOLOGY REPORT*  Clinical Data: Appendectomy.  Abdominal distention.  ABDOMEN - 2 VIEW  Comparison: CT 11/23/2012  Findings: Drain is present in the right lower quadrant.  There are markedly dilated loops of small intestine throughout.  There is also a fairly large amount of colonic gas.  The findings are most consistent with ileus, though a contribution of relative distal small bowel obstruction is not excluded.  IMPRESSION: Marked gaseous distention, small bowel more than large bowel.  Most consistent with ileus.  Partial distal small bowel obstruction not excluded.   Original Report Authenticated By: Paulina Fusi, M.D.     Assessment /  Plan: 1.  Status post open appendectomy and abscess drainage for perforated appendicitis  IV Zosyn continues - WBC decreasing to 10K today  Allow clear liquid diet  Encouraged OOB, ambulation  Will check creatinine on JP drain fluid to rule out ureteral injury given retroperitoneal dissection  Velora Heckler, MD, [facility] Office: [phone]  11/30/2012

## 2012-12-01 LAB — CBC WITH DIFFERENTIAL/PLATELET
Basophils Absolute: 0 10*3/uL (ref 0.0–0.1)
Basophils Relative: 0 % (ref 0–1)
Eosinophils Absolute: 0.2 10*3/uL (ref 0.0–0.7)
Eosinophils Relative: 2 % (ref 0–5)
HCT: 45.4 % (ref 39.0–52.0)
Hemoglobin: 15.6 g/dL (ref 13.0–17.0)
Lymphocytes Relative: 20 % (ref 12–46)
MCH: 31.5 pg (ref 26.0–34.0)
MCHC: 34.4 g/dL (ref 30.0–36.0)
MCV: 91.5 fL (ref 78.0–100.0)
Monocytes Absolute: 0.7 10*3/uL (ref 0.1–1.0)
Monocytes Relative: 7 % (ref 3–12)
Platelets: 546 10*3/uL — ABNORMAL HIGH (ref 150–400)
RDW: 12.6 % (ref 11.5–15.5)

## 2012-12-01 LAB — BASIC METABOLIC PANEL
BUN: 8 mg/dL (ref 6–23)
Calcium: 8.3 mg/dL — ABNORMAL LOW (ref 8.4–10.5)
Chloride: 96 mEq/L (ref 96–112)
Creatinine, Ser: 1.04 mg/dL (ref 0.50–1.35)
GFR calc Af Amer: 81 mL/min — ABNORMAL LOW (ref >90)
GFR calc non Af Amer: 70 mL/min — ABNORMAL LOW (ref >90)

## 2012-12-01 MED ORDER — HYDROCODONE-ACETAMINOPHEN 5-325 MG PO TABS
1.0000 | ORAL_TABLET | ORAL | Status: DC | PRN
  Administered 2012-12-01 – 2012-12-02 (×6): 2 via ORAL
  Administered 2012-12-05: 1 via ORAL
  Administered 2012-12-05: 2 via ORAL
  Administered 2012-12-05: 1 via ORAL
  Filled 2012-12-01 (×6): qty 2
  Filled 2012-12-01: qty 1
  Filled 2012-12-01 (×2): qty 2

## 2012-12-01 MED ORDER — LEVOTHYROXINE SODIUM 100 MCG PO TABS
100.0000 ug | ORAL_TABLET | Freq: Every day | ORAL | Status: DC
  Administered 2012-12-02 – 2012-12-05 (×4): 100 ug via ORAL
  Filled 2012-12-01 (×6): qty 1

## 2012-12-01 NOTE — Progress Notes (Signed)
Patient ID: Jack Stephens, male   DOB: May 12, 1941, 72 y.o.   MRN: [id number]  General Surgery - [facility] - Progress Note  POD# 8  Subjective: Patient improving.  Passing flatus.  No nausea or emesis.  Ambulatory.  Tolerating clear liquid diet.  Objective: Vital signs in last 24 hours: Temp:  [97.9 F (36.6 C)-98.1 F (36.7 C)] 98 F (36.7 C) (03/18 0615) Pulse Rate:  [69-84] 78 (03/18 0615) Resp:  [20] 20 (03/18 0615) BP: (149-174)/(91-94) 159/94 mmHg (03/18 0615) SpO2:  [92 %-94 %] 94 % (03/18 0615) Last BM Date: 11/23/12  Intake/Output from previous day: 03/17 0701 - 03/18 0700 In: 3671.7 [P.O.:840; I.V.:2731.7; IV Piggyback:100] Out: 3000 [Urine:2650; Drains:350]  Exam: HEENT - clear, not icteric Neck - soft Chest - clear bilaterally Cor - RRR, no murmur Abd - protuberant, BS present; dressing intact; thin serous from JP Ext - no significant edema Neuro - grossly intact, no focal deficits  Lab Results:   Recent Labs  11/30/12 0410 12/01/12 0423  WBC 10.0 9.1  HGB 14.8 15.6  HCT 43.4 45.4  PLT 447* 546*     Recent Labs  11/29/12 0432 12/01/12 0423  NA 130* 131*  K 3.9 4.1  CL 98 96  CO2 25 27  GLUCOSE 104* 103*  BUN 11 8  CREATININE 0.89 1.04  CALCIUM 8.1* 8.3*    Studies/Results: No results found.  Assessment / Plan: 1.  Status post open appy with perforation  Continue IV Zosyn  Advance to full liquid diet  Begin po meds - Vicodin, Synthroid  JP drainage serous - creatinine consistent with serum  Arrange HHN for wound care  Velora Heckler, MD, [facility] Office: [phone]  12/01/2012

## 2012-12-02 ENCOUNTER — Inpatient Hospital Stay (HOSPITAL_COMMUNITY): Payer: Medicare Other

## 2012-12-02 MED ORDER — PANTOPRAZOLE SODIUM 40 MG IV SOLR
40.0000 mg | INTRAVENOUS | Status: DC
  Administered 2012-12-02 – 2012-12-04 (×3): 40 mg via INTRAVENOUS
  Filled 2012-12-02 (×4): qty 40

## 2012-12-02 NOTE — Progress Notes (Signed)
Spoke to [employer], [place] for [facility] states they have viewed AAS results. Informed patient has not had emesis episode since this am is passing flatus and ambulating.

## 2012-12-02 NOTE — Progress Notes (Signed)
Patient ID: Jack Stephens, male   DOB: Apr 09, 1941, 72 y.o.   MRN: [id number]  General Surgery - [facility] - Progress Note  POD# 9  Subjective: Patient feeling poorly this AM.  Not tolerating diet.  Passing flatus.  No BM.  Emesis just now.  Objective: Vital signs in last 24 hours: Temp:  [97.6 F (36.4 C)-97.9 F (36.6 C)] 97.9 F (36.6 C) (03/19 0527) Pulse Rate:  [70-85] 70 (03/19 0527) Resp:  [18] 18 (03/19 0527) BP: (149-161)/(95-96) 155/96 mmHg (03/19 0527) SpO2:  [93 %-94 %] 94 % (03/19 0527) Last BM Date: 11/23/12  Intake/Output from previous day: 03/18 0701 - 03/19 0700 In: 1725.8 [P.O.:120; I.V.:1405.8; IV Piggyback:200] Out: 2065 [Urine:1575; Drains:490]  Exam: HEENT - clear, not icteric Neck - soft Chest - clear bilaterally Cor - RRR, no murmur Abd - softer, BS present; dressing dry and intact; incisions clear and dry; mild tenderness Ext - no significant edema Neuro - grossly intact, no focal deficits  Lab Results:   Recent Labs  11/30/12 0410 12/01/12 0423  WBC 10.0 9.1  HGB 14.8 15.6  HCT 43.4 45.4  PLT 447* 546*     Recent Labs  12/01/12 0423  NA 131*  K 4.1  CL 96  CO2 27  GLUCOSE 103*  BUN 8  CREATININE 1.04  CALCIUM 8.3*    Studies/Results: No results found.  Assessment / Plan: 1.  Status post open appy for perforated appendicitis with abscess  IV Zosyn day #10  Reduce to clear liquid diet  Rx nausea  Increase IV rate  Check AXR now  Check labs in AM  Encourage OOB, ambulation  Wound care  Velora Heckler, MD, [facility] Office: [phone]  12/02/2012

## 2012-12-03 LAB — BASIC METABOLIC PANEL
BUN: 6 mg/dL (ref 6–23)
Chloride: 101 mEq/L (ref 96–112)
GFR calc non Af Amer: 61 mL/min — ABNORMAL LOW (ref >90)
Glucose, Bld: 96 mg/dL (ref 70–99)
Potassium: 4.5 mEq/L (ref 3.5–5.1)
Sodium: 132 mEq/L — ABNORMAL LOW (ref 135–145)

## 2012-12-03 LAB — CBC
HCT: 44 % (ref 39.0–52.0)
Hemoglobin: 14.5 g/dL (ref 13.0–17.0)
MCHC: 33 g/dL (ref 30.0–36.0)
RBC: 4.8 MIL/uL (ref 4.22–5.81)
WBC: 7.4 10*3/uL (ref 4.0–10.5)

## 2012-12-03 NOTE — Progress Notes (Signed)
General Surgery [facility]  Appears to be making progress.  Will advance slowly.  If another set-back occurs, will get CT scan of abdomen.  Velora Heckler, MD, [facility] Office: [phone]

## 2012-12-03 NOTE — Progress Notes (Signed)
Nutrition Brief Note  Reason for Assessment: LOS 10 days  Body mass index is 25.85 kg/(m^2). Patient meets criteria for normal body weight based on current BMI.   Current diet order is clear liquid. Labs and medications reviewed.   Pt reported that he had diarrhea early this morning, but that he later had a solid bowel movement. He reports that he currently has no nausea or vomiting and that he feels good. He reported that he would not like his diet upgraded yet because he just wants to "let his body rest." Per nurse pt has shown much improvement. Per MD note, diet will be upgraded as tolerated.  Monitor: advancement of diet, po intake, weight  No nutrition interventions warranted at this time. If nutrition issues arise, please consult RD.   Ebbie Latus RD, LDN

## 2012-12-03 NOTE — Progress Notes (Signed)
10 Days Post-Op  Subjective: Vomited yesterday after seeing Dr. Gerrit Friends and felt tired worn out yesterday.  Had a small BM this AM and is feeling much better this AM.  Objective: Vital signs in last 24 hours: Temp:  [97.7 F (36.5 C)-98.3 F (36.8 C)] 97.7 F (36.5 C) (03/20 0532) Pulse Rate:  [72-79] 72 (03/20 0532) Resp:  [18-20] 18 (03/20 0532) BP: (162-197)/(91-109) 174/91 mmHg (03/20 0532) SpO2:  [94 %-97 %] 94 % (03/20 0532) Last BM Date: 11/23/12 475 from drain yesterday. Diet: clears, Hypertensive, afebrile, HR in the 70's.  Labs ok this Am  Intake/Output from previous day: 12/10/22 0701 - 03/20 0700 In: 2031.7 [P.O.:120; I.V.:1761.7; IV Piggyback:150] Out: 2950 [Urine:2475; Drains:475] Intake/Output this shift: Total I/O In: 513.3 [I.V.:513.3] Out: -   General appearance: alert, cooperative and no distress Resp: clear to auscultation bilaterally GI: soft, still tender, a little red around the drain site, drainage is clear yellow fluid.  Wound looks great.  Staples intact.  Lab Results:   Recent Labs  12/01/12 0423 12/03/12 0521  WBC 9.1 7.4  HGB 15.6 14.5  HCT 45.4 44.0  PLT 546* 459*    BMET  Recent Labs  12/01/12 0423 12/03/12 0521  NA 131* 132*  K 4.1 4.5  CL 96 101  CO2 27 24  GLUCOSE 103* 96  BUN 8 6  CREATININE 1.04 1.17  CALCIUM 8.3* 8.2*   PT/INR No results found for this basename: LABPROT, INR,  in the last 72 hours  No results found for this basename: AST, ALT, ALKPHOS, BILITOT, PROT, ALBUMIN,  in the last 168 hours   Lipase  No results found for this basename: lipase     Studies/Results: Dg Abd 2 Views  2012-12-09  *RADIOLOGY REPORT*  Clinical Data: Right-sided abdominal pain with abdominal distention, nausea, and vomiting.  Recent appendectomy.  ABDOMEN - 2 VIEW  Comparison: Radiographs dated 12/01/2012  Findings: There is persistent dilatation of multiple small bowel loops with stair step air fluid levels.  The colon is not  distended.  There is contrast throughout the nondistended colon from the prior CT scan of 11/23/2012.  Soft tissue drain is present in the right lower quadrant.  No free air.  IMPRESSION: Persistent dilatation of multiple small bowel loops with stair step air fluid levels suggesting obstruction.   Original Report Authenticated By: Francene Boyers, M.D.     Medications: . enoxaparin (LOVENOX) injection  40 mg Subcutaneous Daily  . levothyroxine  100 mcg Oral QAC breakfast  . pantoprazole (PROTONIX) IV  40 mg Intravenous Q24H  . piperacillin-tazobactam (ZOSYN)  IV  3.375 g Intravenous Q8H    Assessment/Plan Acute appendicitis with perforation and Abscess.  S/p Attempted laparoscopic appendectomy 2. Open appendectomy 3. Drainage of intra-abdominal abscess  SURGEON: Velora Heckler, MD, FACS, 11/24/2012.  Hypothyroidism  GERD (gastroesophageal reflux disease   Plan:  I will leave him on clears for now and let him see how he does with it this AM.  We may be able to go up to fulls later if he continues to do well.       LOS: 10 days    Jack Stephens 12/03/2012

## 2012-12-04 MED ORDER — POTASSIUM CHLORIDE IN NACL 20-0.9 MEQ/L-% IV SOLN
INTRAVENOUS | Status: DC
  Administered 2012-12-04: 22:00:00 via INTRAVENOUS
  Filled 2012-12-04: qty 1000

## 2012-12-04 NOTE — Progress Notes (Signed)
General Surgery [facility]  Patient seen and examined.  Wife at bedside.  Advancing diet.  Likely home tomorrow.  Will ask partners to remove drain prior to discharge.  Complete IV abx's.  Does not need Rx for home abx.  HHN requested to assist with wound care.  Velora Heckler, MD, [facility] Office: [phone]

## 2012-12-04 NOTE — Progress Notes (Signed)
11 Days Post-Op  Subjective: Pt feels good today, tolerating 1/2 liquid tray.  Pt had a good BM yesterday, small one this am, +flatus, urinating well.  Ambulating through the halls well.  Minimal pain.  Having dressing changes to RLQ wound.  Objective: Vital signs in last 24 hours: Temp:  [97.7 F (36.5 C)-97.9 F (36.6 C)] 97.9 F (36.6 C) (03/21 0549) Pulse Rate:  [78-92] 78 (03/21 0549) BP: (134-158)/(87-90) 134/87 mmHg (03/21 0549) SpO2:  [94 %-99 %] 99 % (03/21 0549) Last BM Date: 12-03-12  Intake/Output from previous day: 03/20 0701 - 03/21 0700 In: 3521.7 [P.O.:690; I.V.:2781.7; IV Piggyback:50] Out: 1675 [Urine:1425; Drains:250] Intake/Output this shift:    PE: Gen:  Alert, NAD, pleasant Abd: Soft, NT/ND, +BS, no HSM, RLQ wound C/D/I, drain with clear yellowish drainage ( last 24 hours), a little erythema around drain site, likely reactive since NT   Lab Results:   Recent Labs  12/03/12 0521  WBC 7.4  HGB 14.5  HCT 44.0  PLT 459*   BMET  Recent Labs  12/03/12 0521  NA 132*  K 4.5  CL 101  CO2 24  GLUCOSE 96  BUN 6  CREATININE 1.17  CALCIUM 8.2*   PT/INR No results found for this basename: LABPROT, INR,  in the last 72 hours CMP     Component Value Date/Time   NA 132* 12/03/2012 0521   K 4.5 12/03/2012 0521   CL 101 12/03/2012 0521   CO2 24 12/03/2012 0521   GLUCOSE 96 12/03/2012 0521   BUN 6 12/03/2012 0521   CREATININE 1.17 12/03/2012 0521   CALCIUM 8.2* 12/03/2012 0521   GFRNONAA 61* 12/03/2012 0521   GFRAA 71* 12/03/2012 0521   Lipase  No results found for this basename: lipase       Studies/Results: Dg Abd 2 Views  2012/12/03  *RADIOLOGY REPORT*  Clinical Data: Right-sided abdominal pain with abdominal distention, nausea, and vomiting.  Recent appendectomy.  ABDOMEN - 2 VIEW  Comparison: Radiographs dated 12/01/2012  Findings: There is persistent dilatation of multiple small bowel loops with stair step air fluid levels.  The colon is  not distended.  There is contrast throughout the nondistended colon from the prior CT scan of 11/23/2012.  Soft tissue drain is present in the right lower quadrant.  No free air.  IMPRESSION: Persistent dilatation of multiple small bowel loops with stair step air fluid levels suggesting obstruction.   Original Report Authenticated By: Francene Boyers, M.D.     Anti-infectives: Anti-infectives   Start     Dose/Rate Route Frequency Ordered Stop   11/24/12 0200  piperacillin-tazobactam (ZOSYN) IVPB 3.375 g     3.375 g 12.5 mL/hr over 240 Minutes Intravenous 3 times per day 11/24/12 0059     11/23/12 1915  piperacillin-tazobactam (ZOSYN) IVPB 3.375 g     3.375 g 100 mL/hr over 30 Minutes Intravenous  Once 11/23/12 1911 11/23/12 2015       Assessment/Plan Acute appendicitis with perforation and Abscess. S/p Attempted laparoscopic appendectomy 2. Open appendectomy 3. Drainage of intra-abdominal abscess  SURGEON: Velora Heckler, MD, FACS, 11/24/2012.   Hypothyroidism  GERD (gastroesophageal reflux disease  Hyponatremia - will cont to monitor, switched to NS w/K and KVO only  Plan: Advance to fulls. Maybe soft diet at dinner, if not will advance tomorrow and plan on discharge with [facility] wound care.     LOS: 11 days    DORT, Aundra Millet 12/04/2012, 7:57 AM Pager: [phone]

## 2012-12-05 LAB — BASIC METABOLIC PANEL
BUN: 5 mg/dL — ABNORMAL LOW (ref 6–23)
CO2: 24 mEq/L (ref 19–32)
Chloride: 98 mEq/L (ref 96–112)
GFR calc Af Amer: 74 mL/min — ABNORMAL LOW (ref >90)
Potassium: 4 mEq/L (ref 3.5–5.1)

## 2012-12-05 MED ORDER — HYDROCODONE-ACETAMINOPHEN 5-325 MG PO TABS: 1.0000 | ORAL_TABLET | ORAL | Status: DC | PRN

## 2012-12-05 MED ORDER — AMOXICILLIN-POT CLAVULANATE 875-125 MG PO TABS: 1.0000 | ORAL_TABLET | Freq: Two times a day (BID) | ORAL | Status: DC

## 2012-12-05 NOTE — Care Management (Signed)
Cm spoke with patient at bedside concerning discharge planning with RN present. Per pt choice [facility] to provide Hh services upon discharge. Cm verified start of care 12/06/12 with [facility] rep Britta Mccreedy at [phone]. Pt's spouse to assist in home care. CM spoke with Dr.Ingram concerning MD order specifically ordering "Do not use [facility] for [facility] services". Per MD if pt made choice then ok for this pt. No other needs stated.   Roxy Manns Vickee Mormino,RN,BSN [phone]

## 2012-12-05 NOTE — Progress Notes (Signed)
12 Days Post-Op  Subjective: Doing well. Wants to go home. Tolerating diet. Voiding. Good bowel movement. Minimal pain.  JP drainage is serous. I have asked the nurses to remove that today.  Objective: Vital signs in last 24 hours: Temp:  [97.3 F (36.3 C)-97.6 F (36.4 C)] 97.6 F (36.4 C) (03/22 0535) Pulse Rate:  [70-84] 70 (03/22 0535) Resp:  [16-18] 16 (03/22 0535) BP: (136-142)/(83-97) 136/83 mmHg (03/22 0535) SpO2:  [94 %-99 %] 99 % (03/22 0535) Last BM Date: 12/03/12  Intake/Output from previous day: 03/21 0701 - 03/22 0700 In: 1845.3 [P.O.:960; I.V.:735.3; IV Piggyback:150] Out: 2220 [Urine:2100; Drains:120] Intake/Output this shift:    General appearance: overt. Cooperative. Good spirits. No distress. Looks good. GI: abdomen soft and nontender. JP drainage serous, not infected. Transverse wound packed open, clean. Infraumbilical scar clean. Staples in place  Lab Results:  Results for orders placed during the hospital encounter of 11/23/12 (from the past 24 hour(s))  BASIC METABOLIC PANEL     Status: Abnormal   Collection Time    12/05/12  5:49 AM      Result Value Range   Sodium 131 (*) 135 - 145 mEq/L   Potassium 4.0  3.5 - 5.1 mEq/L   Chloride 98  96 - 112 mEq/L   CO2 24  19 - 32 mEq/L   Glucose, Bld 85  70 - 99 mg/dL   BUN 5 (*) 6 - 23 mg/dL   Creatinine, Ser 1.91  0.50 - 1.35 mg/dL   Calcium 8.0 (*) 8.4 - 10.5 mg/dL   GFR calc non Af Amer 64 (*) >90 mL/min   GFR calc Af Amer 74 (*) >90 mL/min     Studies/Results: @RISRSLT24 @  . enoxaparin (LOVENOX) injection  40 mg Subcutaneous Daily  . levothyroxine  100 mcg Oral QAC breakfast  . pantoprazole (PROTONIX) IV  40 mg Intravenous Q24H  . piperacillin-tazobactam (ZOSYN)  IV  3.375 g Intravenous Q8H     Assessment/Plan: s/p Procedure(s): laparoscopic appendectomy   Acute appendicitis with perforation and abscess, status post open appendectomy and drainage of abscess, 11/24/2012 Discharge home  today Diet and activities discussed HHN for wound care Oriented removed drainage staples prior to discharge Prescription for Vicodin and Augmentin given Return to see Dr. Gerrit Friends in 2 weeks.  Hypothyroidism  GERD  Hyponatremia,Sodium 131. Electrolytes otherwise normal.   @PROBHOSP @  LOS: 12 days    Kenyah Luba M 12/05/2012  . .prob

## 2012-12-05 NOTE — Progress Notes (Signed)
Assessment unchanged. Dressing change done, JP drain removed and staples removed with steri's applied as ordered prior to dc home. AVS reviewed with pt and wife with verbalized understanding of meds to resume, follow-up care, diet as well as activity to resume through teach back. Pt and wife observed dressing change yet refused to demonstrated or assist nurse with dressing change as learning for dressing changes at home. Pt stated " I want to wait and let the home care nurse teach my wife at home. I don't want to frighten her at this point." Reassurance given to both pt and wife. Dr. Derrell Lolling made aware. Supplies for wet to dry dressings provided. [facility] RN to see in the morning. Script x 1 given for analgesic as provided by MD. Recently reconciled antibiotic script provided with Dr. Derrell Lolling who instructed pt not to take. Pt verbalized understanding. Discharged via wheelchair to front entrance to meet awaiting vehicle to carry home. Accompanied by NT and wife.

## 2012-12-05 NOTE — Discharge Summary (Signed)
  Patient ID: Jack Stephens [id number] 72 y.o. 04-11-1941  11/23/2012  Discharge date and time: December 05, 2012  Admitting Physician: CCS,MD  Discharge Physician: Ernestene Mention  Admission Diagnoses: Appendicitis [541]  Discharge Diagnoses: Ruptured appendicitis with abscess  Operations: Procedure(s): Laparoscopy, Open appendectomy, drainage of intra-abdominal abscess Velora Heckler, M.D., [facility]  Admission Condition: fair  Discharged Condition: good  Indication for Admission:The patient is a 72 year old, white male who presents on  referral from Dr. Weldon Picking with four-day history of abdominal pain  localizing to the right lower quadrant. Laboratory studies showed an  elevated white blood cell count of 22,000 with a left shift. CT scan  abdomen and pelvis showed a diffuse inflammatory process in the right  lower quadrant consistent with perforated appendicitis. The patient was  referred to General Surgery. He was Admitted for urgent surgery.   Hospital Course: On the day of admission the patient was taken to the operating room. He underwent attempted laparoscopic appendectomy, open appendectomy, and drainage of intra-abdominal abscess.final pathology report showed transmural acute gangrenous appendicitis.  Postoperatively he was maintained on broad-spectrum antibiotics, through his hospital course. JP drainage became clear and serous and was removed on the day of discharge. His wound was packed open due to the significant contamination at the time of surgery. He had a very prolonged ileus which ultimately resolved. He resumed ambulation, uneventful voiding, resumption of bowel movements and torso but regular diet.  On the day of discharge his JP drain was removed. His abdomen was soft and nontender. His right lower quadrant wound was packed open and clean. The staples were removed from his umbilical incision. He was given a prescription for 3 more days of Augmentin and he was  given a prescription for Vicodin. Diet and activities were discussed. Home health nursing has already been arranged and this was discussed with the case manager Saturday morning. He was about to call office to make reports that the jerkin in 2 weeks.    Consults: None  Significant Diagnostic Studies: Pathology  Treatments: surgery: Open appendectomy and drainage of abscess  Disposition: Home  Patient Instructions:    Medication List    TAKE these medications       amoxicillin-clavulanate 875-125 MG per tablet  Commonly known as:  AUGMENTIN  Take 1 tablet by mouth 2 (two) times daily.     BENEFIBER DRINK MIX PO  Take 2 scoop by mouth at bedtime.     HYDROcodone-acetaminophen 5-325 MG per tablet  Commonly known as:  NORCO/VICODIN  Take 1-2 tablets by mouth every 4 (four) hours as needed for pain.     levothyroxine 112 MCG tablet  Commonly known as:  SYNTHROID, LEVOTHROID  Take 112 mcg by mouth daily.     omeprazole 20 MG capsule  Commonly known as:  PRILOSEC  Take 20 mg by mouth daily.        Activity: diet, activities, wound care, and medications were all discussed and included in his discharge instructions. Diet: low fat, low cholesterol diet Wound Care: saline and fine mesh gauze moist to dry dressings twice a day.  Follow-up:  With Dr. Gerrit Friends in 2 weeks.  Signed: Angelia Mould. Derrell Lolling, M.D., FACS General and minimally invasive surgery Breast and Colorectal Surgery  12/05/2012, 10:45 AM

## 2012-12-07 ENCOUNTER — Telehealth (INDEPENDENT_AMBULATORY_CARE_PROVIDER_SITE_OTHER): Payer: Self-pay

## 2012-12-07 NOTE — Telephone Encounter (Signed)
Pt calling wanting to schedule his f/u appt with Dr Gerrit Friends for 4/4 or 4/7. This pt said he is really concerned about not seeing Dr Gerrit Friends sooner than those dates listed b/c his surgery was 3/10 but the pt just got discharged this past Saturday. I asked the pt if Dr Gerrit Friends saw him before discharge and the pt said yes. I advised pt that I would have you to call him to make his post op appt and he said he shouldn't be having to wait to make a post op appt. I tried to explain to the pt that Dr Gerrit Friends recheck spot were all full and you would have to be the one making the appt.

## 2012-12-07 NOTE — Telephone Encounter (Signed)
Returned pt call. Appt made for 12-16-12.

## 2012-12-16 ENCOUNTER — Encounter (INDEPENDENT_AMBULATORY_CARE_PROVIDER_SITE_OTHER): Payer: Self-pay | Admitting: Surgery

## 2012-12-16 ENCOUNTER — Ambulatory Visit (INDEPENDENT_AMBULATORY_CARE_PROVIDER_SITE_OTHER): Payer: Medicare Other | Admitting: Surgery

## 2012-12-16 VITALS — BP 118/70 | HR 76 | Temp 98.1°F | Resp 16 | Ht 68.0 in | Wt 157.0 lb

## 2012-12-16 DIAGNOSIS — K35209 Acute appendicitis with generalized peritonitis, without abscess, unspecified as to perforation: Secondary | ICD-10-CM

## 2012-12-16 DIAGNOSIS — K352 Acute appendicitis with generalized peritonitis, without abscess: Secondary | ICD-10-CM

## 2012-12-16 DIAGNOSIS — K3532 Acute appendicitis with perforation and localized peritonitis, without abscess: Secondary | ICD-10-CM

## 2012-12-16 NOTE — Progress Notes (Signed)
General Surgery [facility]  Visit Diagnoses: 1. Acute appendicitis with rupture     HISTORY: The patient returns for postoperative visit having undergone open appendectomy with drainage for perforated appendicitis with abscess. Patient has been doing twice-daily dressing changes at home. He has completed his course of antibiotics. His appetite is improving. He is having one to two bowel movements daily. He denies any fevers or chills.  EXAM: Abdomen is soft without distention. Surgical wounds are well-healed. Open wound in right lower quadrant is granulating nicely. It is approximately 50% close by secondary intention.  IMPRESSION: Perforated appendicitis, doing well, wound healing by secondary intention  PLAN: Patient will continue twice-daily dressing changes. Once the wound cannot be packed with gauze, they will switch to antibiotic ointment. Patient may shower once daily. He will return for wound check in about 3 weeks.  Velora Heckler, MD, FACS General & Endocrine Surgery [facility]

## 2012-12-16 NOTE — Patient Instructions (Signed)
Continue dressing changes twice daily.  Shower once daily.  Switch to antibiotic ointment when no longer able to pack wound with gauze.  Velora Heckler, MD, [facility] Office: [phone]

## 2013-01-04 ENCOUNTER — Encounter (INDEPENDENT_AMBULATORY_CARE_PROVIDER_SITE_OTHER): Payer: Self-pay | Admitting: Surgery

## 2013-01-04 ENCOUNTER — Ambulatory Visit (INDEPENDENT_AMBULATORY_CARE_PROVIDER_SITE_OTHER): Payer: Medicare Other | Admitting: Surgery

## 2013-01-04 VITALS — BP 120/76 | HR 78 | Temp 97.0°F | Resp 16 | Ht 68.0 in | Wt 155.4 lb

## 2013-01-04 DIAGNOSIS — K352 Acute appendicitis with generalized peritonitis, without abscess: Secondary | ICD-10-CM

## 2013-01-04 DIAGNOSIS — K3532 Acute appendicitis with perforation and localized peritonitis, without abscess: Secondary | ICD-10-CM

## 2013-01-04 NOTE — Patient Instructions (Signed)
  COCOA BUTTER & VITAMIN E CREAM  ([employer] or other brand)  Apply cocoa butter/vitamin E cream to your incision 2 - 3 times daily.  Massage cream into incision for one minute with each application.  Use sunscreen (50 SPF or higher) for first 6 months after surgery if area is exposed to sun.  You may substitute Mederma or other scar reducing creams as desired.   

## 2013-01-04 NOTE — Progress Notes (Signed)
General Surgery [facility]  Visit Diagnoses: 1. Acute appendicitis with rupture     HISTORY: Patient returns for wound check following open surgery for perforated appendicitis. Patient states that his appetite is improving. His stamina is still not what it had been. He denies fevers or chills. Bowel function is essentially normal.  EXAM: Abdomen is soft nontender without distention. Surgical wounds have healed nicely. Right lower quadrant incision is nearly completely epithelialized. There is no sign of hernia. There is no sign of infection.  IMPRESSION: Status post open appendectomy for perforated appendicitis  PLAN: Patient will apply topical antibiotic ointment to his remaining incision twice daily for one week. He will then change to cocoa butter application to all incisions. Activity is unrestricted.  Patient will return for surgical care as needed.  Velora Heckler, MD, FACS General & Endocrine Surgery [facility]

## 2014-11-28 IMAGING — CR DG ABDOMEN 2V
4 series · 4 of 4 positions shown · non-contrast
Comparison: Radiographs dated 12/01/2012

CLINICAL DATA: Right-sided abdominal pain with abdominal
distention, nausea, and vomiting.  Recent appendectomy.

ABDOMEN - 2 VIEW

[w abdomen upright (1 of 2)]
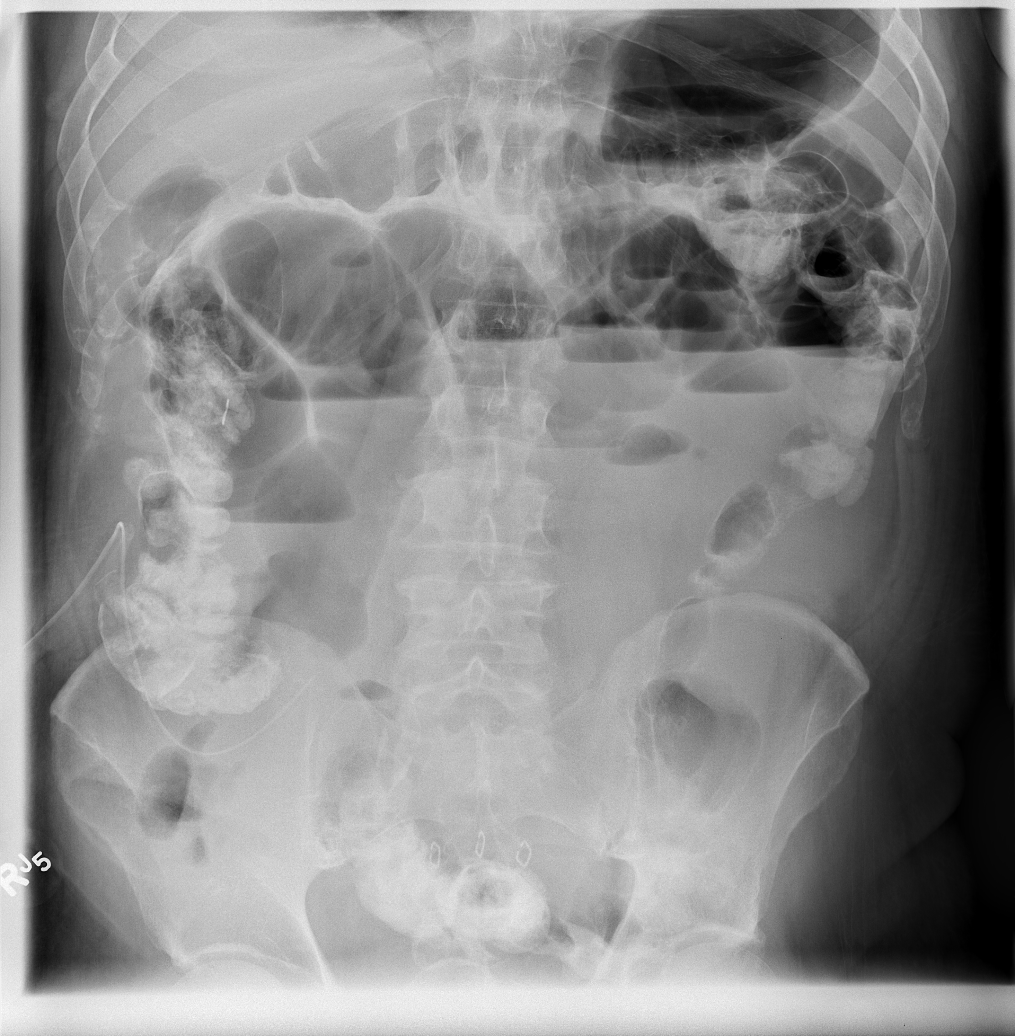

[w abdomen upright (2 of 2)]
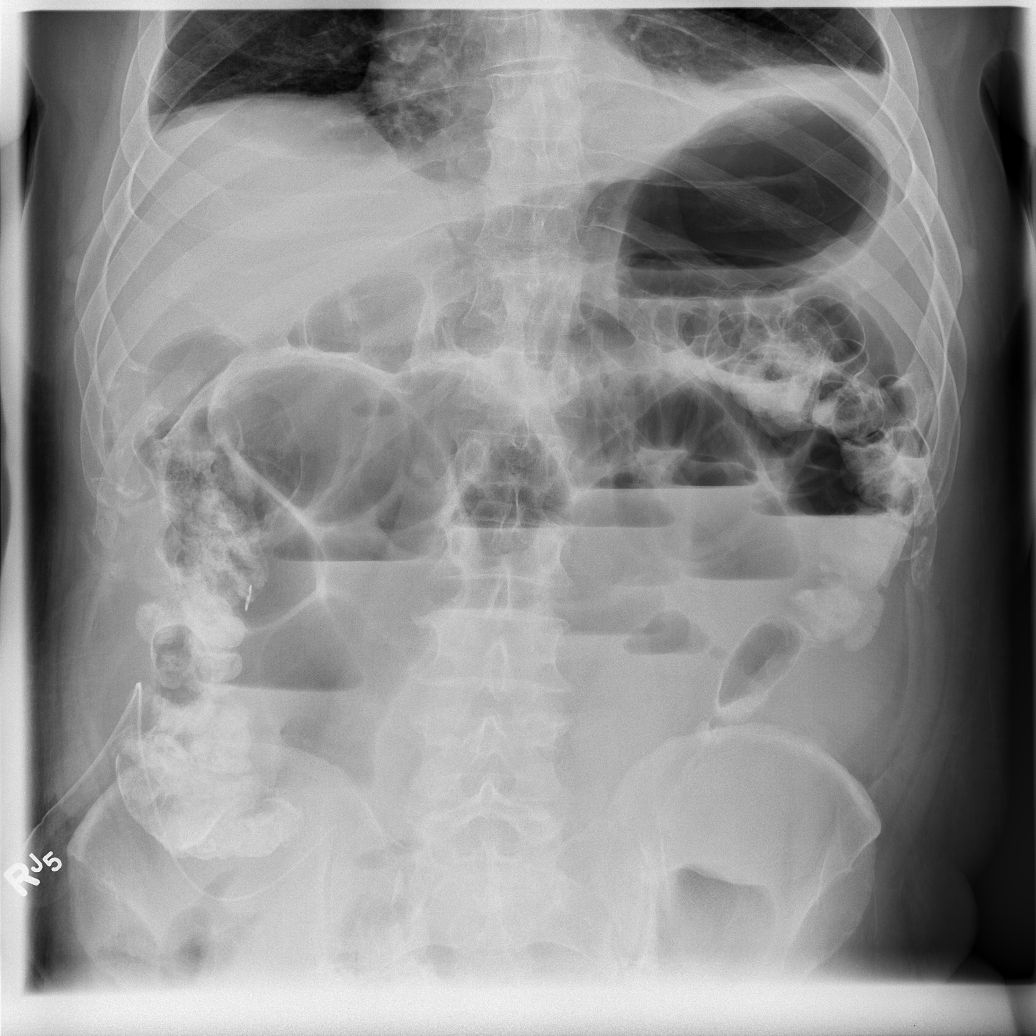

[t abdomen supine (1 of 2)]
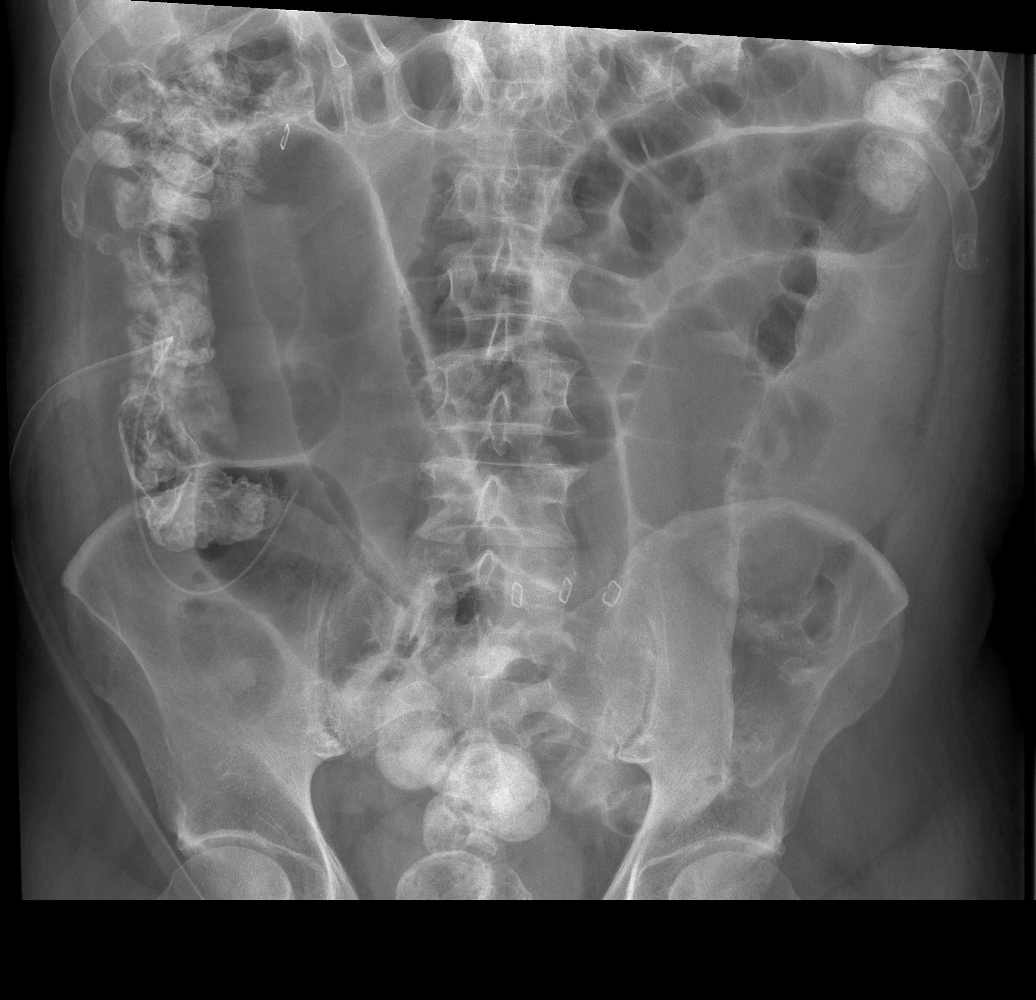

[t abdomen supine (2 of 2)]
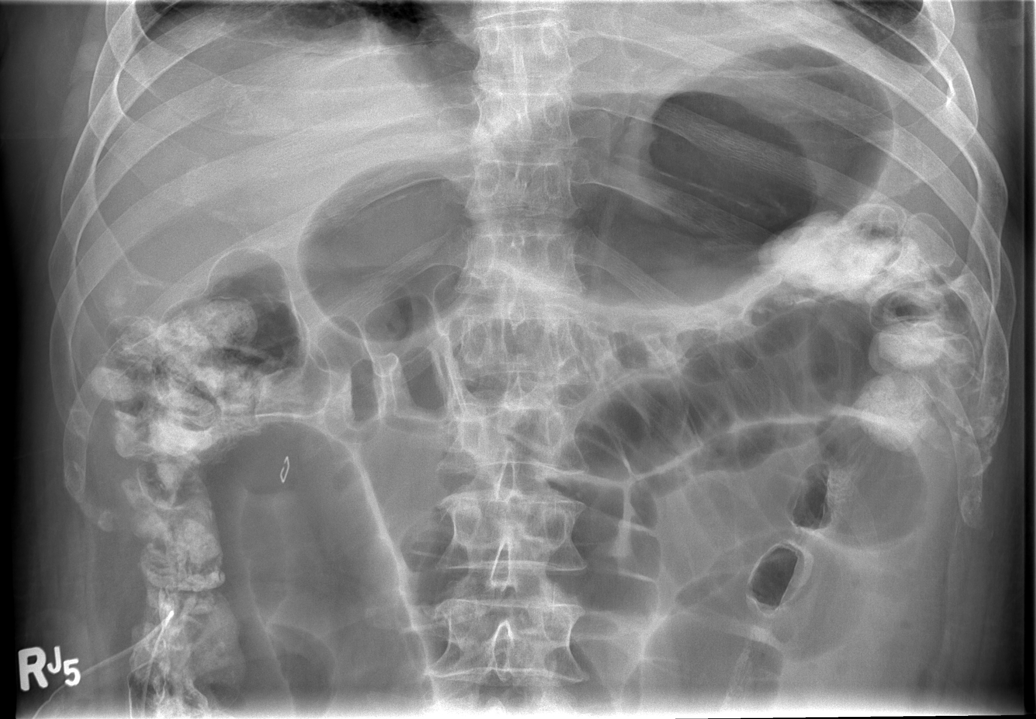

[4 of 4 positions shown; findings below may reference images not displayed]

FINDINGS: There is persistent dilatation of multiple small bowel
loops with stair step air fluid levels.  The colon is not
distended.  There is contrast throughout the nondistended colon
from the prior CT scan of 11/23/2012.

Soft tissue drain is present in the right lower quadrant.  No free
air.
IMPRESSION: Persistent dilatation of multiple small bowel loops with stair step
air fluid levels suggesting obstruction.

## 2015-02-16 DIAGNOSIS — Z131 Encounter for screening for diabetes mellitus: Secondary | ICD-10-CM | POA: Diagnosis not present

## 2015-02-16 DIAGNOSIS — E780 Pure hypercholesterolemia: Secondary | ICD-10-CM | POA: Diagnosis not present

## 2015-02-16 DIAGNOSIS — E039 Hypothyroidism, unspecified: Secondary | ICD-10-CM | POA: Diagnosis not present

## 2015-02-23 DIAGNOSIS — K279 Peptic ulcer, site unspecified, unspecified as acute or chronic, without hemorrhage or perforation: Secondary | ICD-10-CM | POA: Diagnosis not present

## 2015-02-23 DIAGNOSIS — E039 Hypothyroidism, unspecified: Secondary | ICD-10-CM | POA: Diagnosis not present

## 2015-05-25 DIAGNOSIS — K279 Peptic ulcer, site unspecified, unspecified as acute or chronic, without hemorrhage or perforation: Secondary | ICD-10-CM | POA: Diagnosis not present

## 2015-05-25 DIAGNOSIS — E039 Hypothyroidism, unspecified: Secondary | ICD-10-CM | POA: Diagnosis not present

## 2015-06-28 ENCOUNTER — Other Ambulatory Visit: Payer: Self-pay | Admitting: Gastroenterology

## 2015-06-29 ENCOUNTER — Other Ambulatory Visit: Payer: Self-pay | Admitting: Gastroenterology

## 2015-06-29 DIAGNOSIS — K210 Gastro-esophageal reflux disease with esophagitis: Secondary | ICD-10-CM | POA: Diagnosis not present

## 2015-06-29 DIAGNOSIS — D126 Benign neoplasm of colon, unspecified: Secondary | ICD-10-CM | POA: Diagnosis not present

## 2015-06-29 NOTE — Addendum Note (Signed)
Addended byClarene Essex on: 06/29/2015 10:58 AM   Modules accepted: Orders

## 2015-06-29 NOTE — Addendum Note (Signed)
Addended byClarene Essex on: 06/29/2015 08:09 AM   Modules accepted: Orders

## 2015-07-07 ENCOUNTER — Encounter (HOSPITAL_COMMUNITY): Payer: Self-pay | Admitting: *Deleted

## 2015-07-10 ENCOUNTER — Encounter (HOSPITAL_COMMUNITY)
Admission: RE | Disposition: A | Discharge status: Home / Self Care | Payer: Self-pay | Source: Ambulatory Visit | Attending: Gastroenterology

## 2015-07-10 ENCOUNTER — Ambulatory Visit (HOSPITAL_COMMUNITY): Payer: Commercial Managed Care - HMO | Admitting: Anesthesiology

## 2015-07-10 ENCOUNTER — Ambulatory Visit (HOSPITAL_COMMUNITY)
Admission: RE | Admit: 2015-07-10 | Discharge: 2015-07-10 | Disposition: A | Discharge status: Home / Self Care | Payer: Commercial Managed Care - HMO | Source: Ambulatory Visit | Attending: Gastroenterology | Admitting: Gastroenterology

## 2015-07-10 ENCOUNTER — Encounter (HOSPITAL_COMMUNITY): Payer: Self-pay | Admitting: Anesthesiology

## 2015-07-10 DIAGNOSIS — Z87891 Personal history of nicotine dependence: Secondary | ICD-10-CM | POA: Diagnosis not present

## 2015-07-10 DIAGNOSIS — K644 Residual hemorrhoidal skin tags: Secondary | ICD-10-CM | POA: Diagnosis not present

## 2015-07-10 DIAGNOSIS — K5790 Diverticulosis of intestine, part unspecified, without perforation or abscess without bleeding: Secondary | ICD-10-CM | POA: Diagnosis not present

## 2015-07-10 DIAGNOSIS — K635 Polyp of colon: Secondary | ICD-10-CM | POA: Diagnosis not present

## 2015-07-10 DIAGNOSIS — K648 Other hemorrhoids: Secondary | ICD-10-CM | POA: Insufficient documentation

## 2015-07-10 DIAGNOSIS — Z86010 Personal history of colonic polyps: Secondary | ICD-10-CM | POA: Diagnosis not present

## 2015-07-10 HISTORY — PX: HOT HEMOSTASIS: SHX5433

## 2015-07-10 HISTORY — PX: COLONOSCOPY WITH PROPOFOL: SHX5780

## 2015-07-10 SURGERY — COLONOSCOPY WITH PROPOFOL
Anesthesia: Monitor Anesthesia Care

## 2015-07-10 MED ORDER — PROPOFOL 500 MG/50ML IV EMUL
INTRAVENOUS | Status: DC | PRN
  Administered 2015-07-10: 100 ug/kg/min via INTRAVENOUS

## 2015-07-10 MED ORDER — SODIUM CHLORIDE 0.9 % IV SOLN: INTRAVENOUS | Status: DC

## 2015-07-10 MED ORDER — LIDOCAINE HCL (CARDIAC) 20 MG/ML IV SOLN
INTRAVENOUS | Status: DC | PRN
  Administered 2015-07-10: 50 mg via INTRAVENOUS

## 2015-07-10 MED ORDER — PROPOFOL 10 MG/ML IV BOLUS
INTRAVENOUS | Status: DC | PRN
  Administered 2015-07-10 (×2): 20 mg via INTRAVENOUS

## 2015-07-10 MED ORDER — MIDAZOLAM HCL 5 MG/5ML IJ SOLN
INTRAMUSCULAR | Status: DC | PRN
  Administered 2015-07-10 (×2): 1 mg via INTRAVENOUS

## 2015-07-10 MED ORDER — LACTATED RINGERS IV SOLN
INTRAVENOUS | Status: DC | PRN
  Administered 2015-07-10: 08:00:00 via INTRAVENOUS

## 2015-07-10 MED ORDER — LACTATED RINGERS IV SOLN
INTRAVENOUS | Status: DC
  Administered 2015-07-10: 1000 mL via INTRAVENOUS

## 2015-07-10 NOTE — Anesthesia Preprocedure Evaluation (Signed)
Anesthesia Evaluation  Patient identified by MRN, date of birth, ID band Patient awake    Reviewed: Allergy & Precautions, H&P , NPO status , Patient's Chart, lab work & pertinent test results  Airway Mallampati: II  TM Distance: >3 FB Neck ROM: Full    Dental  (+) Teeth Intact, Caps, Dental Advisory Given,    Pulmonary neg pulmonary ROS, former smoker,    Pulmonary exam normal breath sounds clear to auscultation       Cardiovascular negative cardio ROS Normal cardiovascular exam Rhythm:Regular Rate:Normal     Neuro/Psych negative neurological ROS  negative psych ROS   GI/Hepatic negative GI ROS, Neg liver ROS, GERD  Medicated,  Endo/Other  negative endocrine ROSHypothyroidism   Renal/GU negative Renal ROS  negative genitourinary   Musculoskeletal negative musculoskeletal ROS (+)   Abdominal   Peds  Hematology negative hematology ROS (+)   Anesthesia Other Findings   Reproductive/Obstetrics                             Anesthesia Physical  Anesthesia Plan  ASA: II  Anesthesia Plan: MAC   Post-op Pain Management:    Induction:   Airway Management Planned:   Additional Equipment:   Intra-op Plan:   Post-operative Plan:   Informed Consent: I have reviewed the patients History and Physical, chart, labs and discussed the procedure including the risks, benefits and alternatives for the proposed anesthesia with the patient or authorized representative who has indicated his/her understanding and acceptance.   Dental advisory given  Plan Discussed with: CRNA  Anesthesia Plan Comments:         Anesthesia Quick Evaluation

## 2015-07-10 NOTE — Transfer of Care (Signed)
Immediate Anesthesia Transfer of Care Note  Patient: Jack Stephens  Procedure(s) Performed: Procedure(s): COLONOSCOPY WITH PROPOFOL (N/A) HOT HEMOSTASIS (ARGON PLASMA COAGULATION/BICAP) (N/A)  Patient Location: PACU  Anesthesia Type:MAC  Level of Consciousness: awake, alert , oriented and patient cooperative  Airway & Oxygen Therapy: Patient Spontanous Breathing and Patient connected to face mask oxygen  Post-op Assessment: Report given to RN and Post -op Vital signs reviewed and stable  Post vital signs: Reviewed and stable  Last Vitals:  Filed Vitals:   07/10/15 0908  BP: 125/71  Pulse: 65  Temp: 36.5 C  Resp: 20    Complications: No apparent anesthesia complications

## 2015-07-10 NOTE — Op Note (Signed)
[facility], [id number]   COLONOSCOPY PROCEDURE REPORT     EXAM DATE: 07/10/2015  PATIENT NAME:      Jack Stephens, Jack Stephens           MR #:      [id number]  BIRTHDATE:       02-10-1941      VISIT #:     [phone]  ATTENDING:     Clarene Essex, MD     STATUS:     outpatient ASSISTANT:      Cherylynn Ridges and Jeb Levering  INDICATIONS:  The patient is a 74 yr old male here for a colonoscopy due to high risk patient with personal history of colonic polyps.  PROCEDURE PERFORMED:     Colonoscopy, diagnostic MEDICATIONS:     Lidocaine 50 mg IV, Versed 2 mg IV, and Propofol 190 mg IV ESTIMATED BLOOD LOSS:     None  CONSENT: The patient understands the risks and benefits of the procedure and understands that these risks include, but are not limited to: sedation, allergic reaction, infection, perforation and/or bleeding. Alternative means of evaluation and treatment include, among others: physical exam, x-rays, and/or surgical intervention. The patient elects to proceed with this endoscopic procedure.  DESCRIPTION OF PROCEDURE: During intra-op preparation period all mechanical & medical equipment was checked for proper function. Hand hygiene and appropriate measures for infection prevention was taken. After the risks, benefits and alternatives of the procedure were thoroughly explained, Informed consent was verified, confirmed and timeout was successfully executed by the treatment team. A digital exam revealed external hemorrhoids. The Pentax Ped Colon [id number] endoscope was introduced through the anus and advanced to the cecum, which was identified by both the appendix and ileocecal valve.to advance to the cecum did require abdominal pressure but no position changes and the prep was  adequate The instrument was then slowly withdrawn as the colon was fully examined.Estimated blood loss is zero unless otherwise noted in this  procedure report. the findings are recorded below      Retroflexed views revealed internal Grade I hemorrhoids. The scope was then completely withdrawn from the patient and the procedure terminated. SCOPE WITHDRAWAL TIME: see nurse's note    ADVERSE EVENTS:      There were no immediate complications.  IMPRESSIONS:     1. Internal/external hemorrhoids 2. Scattered sigmoid and distal descending diverticuli 3. Otherwise within normal limits to the cecum without signs of residual polyp in the ascending including retroflexed view in the cecum  RECOMMENDATIONS:     follow-up when necessary or repeat colon screening in 5 years if doing well medically RECALL:     as needed or in 5 years as above  _____________________________ Clarene Essex, MD eSigned:  Clarene Essex, MD 07/10/2015 9:06 AM   cc:  Orpah Melter, M.D.   CPT CODES: ICD CODES:  The ICD and CPT codes recommended by this software are interpretations from the data that the clinical staff has captured with the software.  The verification of the translation of this report to the ICD and CPT codes and modifiers is the sole responsibility of the health care institution and practicing physician where this report was generated.  [employer]. will not be held responsible for the validity of the ICD and CPT codes included on this report.  AMA assumes no liability for data contained or not contained herein. CPT is a Designer, television/film set of the [employer].  PATIENT NAME:  Jack Stephens, Jack Stephens MR#: [id number]

## 2015-07-10 NOTE — Progress Notes (Signed)
Jack Stephens 8:27 AM  Subjective: Patient without any GI complaints and no change in medical history since I saw him recently in the office  Objective: Vital signs stable afebrile no acute distress  Assessment: History of colon polyps 1 difficult to remove due for repeat screening  Plan: Okay to proceed with colonoscopy with anesthesia assistance this morning  [facility]  Pager [phone] After 5PM or if no answer call [phone]

## 2015-07-10 NOTE — Anesthesia Postprocedure Evaluation (Signed)
Anesthesia Post Note  Patient: Jack Stephens  Procedure(s) Performed: Procedure(s) (LRB): COLONOSCOPY WITH PROPOFOL (N/A) HOT HEMOSTASIS (ARGON PLASMA COAGULATION/BICAP) (N/A)  Anesthesia type: MAC  Patient location: PACU  Post pain: Pain level controlled  Post assessment: Post-op Vital signs reviewed  Last Vitals: BP 161/64 mmHg  Pulse 53  Temp(Src) 36.5 C (Oral)  Resp 18  SpO2 99%  Post vital signs: Reviewed  Level of consciousness: awake  Complications: No apparent anesthesia complications

## 2015-07-10 NOTE — Discharge Instructions (Addendum)
Call if question or problem and follow-up as needed otherwise repeat colonoscopy in 5 years if doing well medically and okay to do in our office at that timeColonoscopy, Care After These instructions give you information on caring for yourself after your procedure. Your doctor may also give you more specific instructions. Call your doctor if you have any problems or questions after your procedure. HOME CARE  Do not drive for 24 hours.  Do not sign important papers or use machinery for 24 hours.  You may shower.  You may go back to your usual activities, but go slower for the first 24 hours.  Take rest breaks often during the first 24 hours.  Walk around or use warm packs on your belly (abdomen) if you have belly cramping or gas.  Drink enough fluids to keep your pee (urine) clear or pale yellow.  Resume your normal diet. Avoid heavy or fried foods.  Avoid drinking alcohol for 24 hours or as told by your doctor.  Only take medicines as told by your doctor. If a tissue sample (biopsy) was taken during the procedure:   Do not take aspirin or blood thinners for 7 days, or as told by your doctor.  Do not drink alcohol for 7 days, or as told by your doctor.  Eat soft foods for the first 24 hours. GET HELP IF: You still have a small amount of blood in your poop (stool) 2-3 days after the procedure. GET HELP RIGHT AWAY IF:  You have more than a small amount of blood in your poop.  You see clumps of tissue (blood clots) in your poop.  Your belly is puffy (swollen).  You feel sick to your stomach (nauseous) or throw up (vomit).  You have a fever.  You have belly pain that gets worse and medicine does not help. MAKE SURE YOU:  Understand these instructions.  Will watch your condition.  Will get help right away if you are not doing well or get worse.   This information is not intended to replace advice given to you by your health care provider. Make sure you discuss any  questions you have with your health care provider.   Document Released: 10/05/2010 Document Revised: 09/07/2013 Document Reviewed: 05/10/2013 Elsevier Interactive Patient Education [employer].

## 2015-07-11 ENCOUNTER — Encounter (HOSPITAL_COMMUNITY): Payer: Self-pay | Admitting: Gastroenterology

## 2016-06-07 DIAGNOSIS — E039 Hypothyroidism, unspecified: Secondary | ICD-10-CM | POA: Diagnosis not present

## 2016-06-10 DIAGNOSIS — E039 Hypothyroidism, unspecified: Secondary | ICD-10-CM | POA: Diagnosis not present

## 2016-06-10 DIAGNOSIS — K210 Gastro-esophageal reflux disease with esophagitis: Secondary | ICD-10-CM | POA: Diagnosis not present

## 2016-08-05 DIAGNOSIS — E039 Hypothyroidism, unspecified: Secondary | ICD-10-CM | POA: Diagnosis not present

## 2016-08-20 DIAGNOSIS — H2513 Age-related nuclear cataract, bilateral: Secondary | ICD-10-CM | POA: Diagnosis not present

## 2016-08-20 DIAGNOSIS — H5203 Hypermetropia, bilateral: Secondary | ICD-10-CM | POA: Diagnosis not present

## 2016-08-20 DIAGNOSIS — Z135 Encounter for screening for eye and ear disorders: Secondary | ICD-10-CM | POA: Diagnosis not present

## 2017-05-22 DIAGNOSIS — Z136 Encounter for screening for cardiovascular disorders: Secondary | ICD-10-CM | POA: Diagnosis not present

## 2017-05-22 DIAGNOSIS — Z23 Encounter for immunization: Secondary | ICD-10-CM | POA: Diagnosis not present

## 2017-05-22 DIAGNOSIS — Z131 Encounter for screening for diabetes mellitus: Secondary | ICD-10-CM | POA: Diagnosis not present

## 2017-05-22 DIAGNOSIS — K279 Peptic ulcer, site unspecified, unspecified as acute or chronic, without hemorrhage or perforation: Secondary | ICD-10-CM | POA: Diagnosis not present

## 2017-05-22 DIAGNOSIS — E039 Hypothyroidism, unspecified: Secondary | ICD-10-CM | POA: Diagnosis not present

## 2017-05-22 DIAGNOSIS — Z0000 Encounter for general adult medical examination without abnormal findings: Secondary | ICD-10-CM | POA: Diagnosis not present

## 2017-07-21 DIAGNOSIS — E039 Hypothyroidism, unspecified: Secondary | ICD-10-CM | POA: Diagnosis not present

## 2018-05-28 DIAGNOSIS — E039 Hypothyroidism, unspecified: Secondary | ICD-10-CM | POA: Diagnosis not present

## 2018-06-01 DIAGNOSIS — Z0000 Encounter for general adult medical examination without abnormal findings: Secondary | ICD-10-CM | POA: Diagnosis not present

## 2018-06-01 DIAGNOSIS — K4090 Unilateral inguinal hernia, without obstruction or gangrene, not specified as recurrent: Secondary | ICD-10-CM | POA: Diagnosis not present

## 2018-06-01 DIAGNOSIS — E039 Hypothyroidism, unspecified: Secondary | ICD-10-CM | POA: Diagnosis not present

## 2018-10-08 DIAGNOSIS — Z9049 Acquired absence of other specified parts of digestive tract: Secondary | ICD-10-CM | POA: Diagnosis not present

## 2018-10-08 DIAGNOSIS — E039 Hypothyroidism, unspecified: Secondary | ICD-10-CM | POA: Diagnosis not present

## 2018-10-08 DIAGNOSIS — K219 Gastro-esophageal reflux disease without esophagitis: Secondary | ICD-10-CM | POA: Diagnosis not present

## 2018-10-08 DIAGNOSIS — K4090 Unilateral inguinal hernia, without obstruction or gangrene, not specified as recurrent: Secondary | ICD-10-CM | POA: Diagnosis not present

## 2018-10-15 DIAGNOSIS — K469 Unspecified abdominal hernia without obstruction or gangrene: Secondary | ICD-10-CM | POA: Diagnosis not present

## 2018-10-23 DIAGNOSIS — G8918 Other acute postprocedural pain: Secondary | ICD-10-CM | POA: Diagnosis not present

## 2018-10-23 DIAGNOSIS — K4090 Unilateral inguinal hernia, without obstruction or gangrene, not specified as recurrent: Secondary | ICD-10-CM | POA: Diagnosis not present

## 2019-04-22 DIAGNOSIS — E039 Hypothyroidism, unspecified: Secondary | ICD-10-CM | POA: Diagnosis not present

## 2019-04-22 DIAGNOSIS — K279 Peptic ulcer, site unspecified, unspecified as acute or chronic, without hemorrhage or perforation: Secondary | ICD-10-CM | POA: Diagnosis not present

## 2019-07-12 DIAGNOSIS — E039 Hypothyroidism, unspecified: Secondary | ICD-10-CM | POA: Diagnosis not present

## 2019-07-12 DIAGNOSIS — Z23 Encounter for immunization: Secondary | ICD-10-CM | POA: Diagnosis not present

## 2019-10-31 ENCOUNTER — Ambulatory Visit: Payer: Medicare HMO | Attending: Internal Medicine

## 2019-10-31 DIAGNOSIS — Z23 Encounter for immunization: Secondary | ICD-10-CM | POA: Insufficient documentation

## 2019-10-31 NOTE — Progress Notes (Signed)
   Covid-19 Vaccination Clinic  Name:  Jack Stephens    MRN: [id number] DOB: Jul 28, 1941  10/31/2019  Mr. Jack Stephens was observed post Covid-19 immunization for 15 minutes without incidence. He was provided with Vaccine Information Sheet and instruction to access the V-Safe system.   Mr. Jack Stephens was instructed to call 911 with any severe reactions post vaccine: Marland Kitchen Difficulty breathing  . Swelling of your face and throat  . A fast heartbeat  . A bad rash all over your body  . Dizziness and weakness    Immunizations Administered    Name Date Dose VIS Date Route   Pfizer COVID-19 Vaccine 10/31/2019  1:57 PM 0.3 mL 08/27/2019 Intramuscular   Manufacturer: [place]   Lot: [id number]   [place]: [id number]

## 2019-11-23 ENCOUNTER — Ambulatory Visit: Payer: Medicare HMO | Attending: Internal Medicine

## 2019-11-23 DIAGNOSIS — Z23 Encounter for immunization: Secondary | ICD-10-CM

## 2019-11-23 NOTE — Progress Notes (Signed)
   Covid-19 Vaccination Clinic  Name:  Jack Stephens    MRN: [id number] DOB: 05/14/1941  11/23/2019  [name] was observed post Covid-19 immunization for 15 minutes without incident. He was provided with Vaccine Information Sheet and instruction to access the V-Safe system.   [name] was instructed to call 911 with any severe reactions post vaccine: Marland Kitchen Difficulty breathing  . Swelling of face and throat  . A fast heartbeat  . A bad rash all over body  . Dizziness and weakness   Immunizations Administered    Name Date Dose VIS Date Route   Pfizer COVID-19 Vaccine 11/23/2019  2:58 PM 0.3 mL 08/27/2019 Intramuscular   Manufacturer: [place]   Lot: [id number]   [place]: [id number]

## 2019-11-24 ENCOUNTER — Ambulatory Visit: Payer: Medicare HMO

## 2020-04-27 DIAGNOSIS — E039 Hypothyroidism, unspecified: Secondary | ICD-10-CM | POA: Diagnosis not present

## 2020-05-01 DIAGNOSIS — K279 Peptic ulcer, site unspecified, unspecified as acute or chronic, without hemorrhage or perforation: Secondary | ICD-10-CM | POA: Diagnosis not present

## 2020-05-01 DIAGNOSIS — E039 Hypothyroidism, unspecified: Secondary | ICD-10-CM | POA: Diagnosis not present

## 2021-02-09 DIAGNOSIS — H353131 Nonexudative age-related macular degeneration, bilateral, early dry stage: Secondary | ICD-10-CM | POA: Diagnosis not present

## 2021-02-09 DIAGNOSIS — H524 Presbyopia: Secondary | ICD-10-CM | POA: Diagnosis not present

## 2021-02-09 DIAGNOSIS — H2513 Age-related nuclear cataract, bilateral: Secondary | ICD-10-CM | POA: Diagnosis not present

## 2021-02-09 DIAGNOSIS — H43813 Vitreous degeneration, bilateral: Secondary | ICD-10-CM | POA: Diagnosis not present

## 2021-06-28 DIAGNOSIS — Z23 Encounter for immunization: Secondary | ICD-10-CM | POA: Diagnosis not present

## 2021-06-28 DIAGNOSIS — Z125 Encounter for screening for malignant neoplasm of prostate: Secondary | ICD-10-CM | POA: Diagnosis not present

## 2021-06-28 DIAGNOSIS — E039 Hypothyroidism, unspecified: Secondary | ICD-10-CM | POA: Diagnosis not present

## 2021-06-28 DIAGNOSIS — R030 Elevated blood-pressure reading, without diagnosis of hypertension: Secondary | ICD-10-CM | POA: Diagnosis not present

## 2021-06-28 DIAGNOSIS — K279 Peptic ulcer, site unspecified, unspecified as acute or chronic, without hemorrhage or perforation: Secondary | ICD-10-CM | POA: Diagnosis not present

## 2022-02-12 DIAGNOSIS — H524 Presbyopia: Secondary | ICD-10-CM | POA: Diagnosis not present

## 2022-02-12 DIAGNOSIS — H2513 Age-related nuclear cataract, bilateral: Secondary | ICD-10-CM | POA: Diagnosis not present

## 2022-02-12 DIAGNOSIS — H25013 Cortical age-related cataract, bilateral: Secondary | ICD-10-CM | POA: Diagnosis not present

## 2022-02-12 DIAGNOSIS — H353131 Nonexudative age-related macular degeneration, bilateral, early dry stage: Secondary | ICD-10-CM | POA: Diagnosis not present

## 2022-07-04 DIAGNOSIS — E039 Hypothyroidism, unspecified: Secondary | ICD-10-CM | POA: Diagnosis not present

## 2022-07-04 DIAGNOSIS — Z131 Encounter for screening for diabetes mellitus: Secondary | ICD-10-CM | POA: Diagnosis not present

## 2022-07-04 DIAGNOSIS — Z0000 Encounter for general adult medical examination without abnormal findings: Secondary | ICD-10-CM | POA: Diagnosis not present

## 2022-07-04 DIAGNOSIS — Z23 Encounter for immunization: Secondary | ICD-10-CM | POA: Diagnosis not present

## 2022-07-04 DIAGNOSIS — Z13220 Encounter for screening for lipoid disorders: Secondary | ICD-10-CM | POA: Diagnosis not present
